# Patient Record
Sex: Female | Born: 1955 | Race: White | Hispanic: No | Marital: Married | Smoking: Never smoker
Health system: Southern US, Community
[De-identification: ages and names within clinical notes are randomized; demographics above are authoritative.]

## PROBLEM LIST (undated history)

## (undated) DIAGNOSIS — K429 Umbilical hernia without obstruction or gangrene: Secondary | ICD-10-CM

## (undated) DIAGNOSIS — K5792 Diverticulitis of intestine, part unspecified, without perforation or abscess without bleeding: Secondary | ICD-10-CM

## (undated) DIAGNOSIS — K56609 Unspecified intestinal obstruction, unspecified as to partial versus complete obstruction: Secondary | ICD-10-CM

## (undated) DIAGNOSIS — K219 Gastro-esophageal reflux disease without esophagitis: Secondary | ICD-10-CM

## (undated) DIAGNOSIS — D259 Leiomyoma of uterus, unspecified: Secondary | ICD-10-CM

## (undated) DIAGNOSIS — E7800 Pure hypercholesterolemia, unspecified: Secondary | ICD-10-CM

## (undated) HISTORY — DX: Leiomyoma of uterus, unspecified: D25.9

## (undated) HISTORY — PX: TONSILLECTOMY: SUR1361

## (undated) HISTORY — DX: Gastro-esophageal reflux disease without esophagitis: K21.9

## (undated) HISTORY — DX: Diverticulitis of intestine, part unspecified, without perforation or abscess without bleeding: K57.92

## (undated) HISTORY — DX: Pure hypercholesterolemia, unspecified: E78.00

## (undated) HISTORY — PX: UMBILICAL HERNIA REPAIR: SHX196

## (undated) HISTORY — DX: Unspecified intestinal obstruction, unspecified as to partial versus complete obstruction: K56.609

## (undated) HISTORY — DX: Umbilical hernia without obstruction or gangrene: K42.9

---

## 1998-02-08 ENCOUNTER — Other Ambulatory Visit: Admission: RE | Admit: 1998-02-08 | Discharge: 1998-02-08 | Payer: Self-pay | Admitting: Family Medicine

## 2001-06-22 ENCOUNTER — Other Ambulatory Visit: Admission: RE | Admit: 2001-06-22 | Discharge: 2001-06-22 | Payer: Self-pay | Admitting: Gynecology

## 2003-03-15 ENCOUNTER — Other Ambulatory Visit: Admission: RE | Admit: 2003-03-15 | Discharge: 2003-03-15 | Payer: Self-pay | Admitting: Gynecology

## 2004-05-23 ENCOUNTER — Other Ambulatory Visit: Admission: RE | Admit: 2004-05-23 | Discharge: 2004-05-23 | Payer: Self-pay | Admitting: Gynecology

## 2005-10-16 ENCOUNTER — Ambulatory Visit: Payer: Self-pay | Admitting: Family Medicine

## 2005-11-04 ENCOUNTER — Other Ambulatory Visit: Admission: RE | Admit: 2005-11-04 | Discharge: 2005-11-04 | Payer: Self-pay | Admitting: Gynecology

## 2011-08-29 LAB — HM COLONOSCOPY

## 2014-02-16 LAB — HM PAP SMEAR: HM Pap smear: NEGATIVE

## 2014-10-05 ENCOUNTER — Other Ambulatory Visit: Payer: Self-pay

## 2016-02-21 ENCOUNTER — Other Ambulatory Visit: Payer: Self-pay

## 2016-03-06 DIAGNOSIS — Z803 Family history of malignant neoplasm of breast: Secondary | ICD-10-CM | POA: Diagnosis not present

## 2016-03-06 DIAGNOSIS — Z1231 Encounter for screening mammogram for malignant neoplasm of breast: Secondary | ICD-10-CM | POA: Diagnosis not present

## 2016-03-06 LAB — HM MAMMOGRAPHY

## 2016-04-09 ENCOUNTER — Encounter: Payer: Self-pay | Admitting: Family Medicine

## 2016-04-15 ENCOUNTER — Ambulatory Visit (INDEPENDENT_AMBULATORY_CARE_PROVIDER_SITE_OTHER): Payer: BLUE CROSS/BLUE SHIELD | Admitting: Family Medicine

## 2016-04-15 ENCOUNTER — Encounter: Payer: Self-pay | Admitting: Family Medicine

## 2016-04-15 VITALS — BP 128/80 | HR 64 | Ht 68.0 in | Wt 201.4 lb

## 2016-04-15 DIAGNOSIS — Z23 Encounter for immunization: Secondary | ICD-10-CM

## 2016-04-15 DIAGNOSIS — Z0000 Encounter for general adult medical examination without abnormal findings: Secondary | ICD-10-CM

## 2016-04-15 DIAGNOSIS — Z1211 Encounter for screening for malignant neoplasm of colon: Secondary | ICD-10-CM | POA: Diagnosis not present

## 2016-04-15 LAB — POCT URINALYSIS DIPSTICK
BILIRUBIN UA: NEGATIVE
Blood, UA: NEGATIVE
GLUCOSE UA: NEGATIVE
Ketones, UA: NEGATIVE
LEUKOCYTES UA: NEGATIVE
NITRITE UA: NEGATIVE
Protein, UA: NEGATIVE
Spec Grav, UA: >=1.03
UROBILINOGEN UA: NEGATIVE
pH, UA: 6

## 2016-04-15 NOTE — Patient Instructions (Signed)
We will call you with lab results.  You received your Tdap injection today. This is good for 10 years.  Return the stool cards at your convenience.   Preventative Care for Adults - Female      MAINTAIN REGULAR HEALTH EXAMS:  A routine yearly physical is a good way to check in with your primary care provider about your health and preventive screening. It is also an opportunity to share updates about your health and any concerns you have, and receive a thorough all-over exam.   Most health insurance companies pay for at least some preventative services.  Check with your health plan for specific coverages.  WHAT PREVENTATIVE SERVICES DO WOMEN NEED?  Adult women should have their weight and blood pressure checked regularly.   Women age 3 and older should have their cholesterol levels checked regularly.  Women should be screened for cervical cancer with a Pap smear and pelvic exam beginning at either age 60, or 3 years after they become sexually activity.    Breast cancer screening generally begins at age 78 with a mammogram and breast exam by your primary care provider.    Beginning at age 4 and continuing to age 16, women should be screened for colorectal cancer.  Certain people may need continued testing until age 65.  Updating vaccinations is part of preventative care.  Vaccinations help protect against diseases such as the flu.  Osteoporosis is a disease in which the bones lose minerals and strength as we age. Women ages 79 and over should discuss this with their caregivers, as should women after menopause who have other risk factors.  Lab tests are generally done as part of preventative care to screen for anemia and blood disorders, to screen for problems with the kidneys and liver, to screen for bladder problems, to check blood sugar, and to check your cholesterol level.  Preventative services generally include counseling about diet, exercise, avoiding tobacco, drugs, excessive  alcohol consumption, and sexually transmitted infections.    GENERAL RECOMMENDATIONS FOR GOOD HEALTH:  Healthy diet:  Eat a variety of foods, including fruit, vegetables, animal or vegetable protein, such as meat, fish, chicken, and eggs, or beans, lentils, tofu, and grains, such as rice.  Drink plenty of water daily.  Decrease saturated fat in the diet, avoid lots of red meat, processed foods, sweets, fast foods, and fried foods.  Exercise:  Aerobic exercise helps maintain good heart health. At least 30-40 minutes of moderate-intensity exercise is recommended. For example, a brisk walk that increases your heart rate and breathing. This should be done on most days of the week.   Find a type of exercise or a variety of exercises that you enjoy so that it becomes a part of your daily life.  Examples are running, walking, swimming, water aerobics, and biking.  For motivation and support, explore group exercise such as aerobic class, spin class, Zumba, Yoga,or  martial arts, etc.    Set exercise goals for yourself, such as a certain weight goal, walk or run in a race such as a 5k walk/run.  Speak to your primary care provider about exercise goals.  Disease prevention:  If you smoke or chew tobacco, find out from your caregiver how to quit. It can literally save your life, no matter how long you have been a tobacco user. If you do not use tobacco, never begin.   Maintain a healthy diet and normal weight. Increased weight leads to problems with blood pressure and diabetes.  The Body Mass Index or BMI is a way of measuring how much of your body is fat. Having a BMI above 27 increases the risk of heart disease, diabetes, hypertension, stroke and other problems related to obesity. Your caregiver can help determine your BMI and based on it develop an exercise and dietary program to help you achieve or maintain this important measurement at a healthful level.  High blood pressure causes heart and  blood vessel problems.  Persistent high blood pressure should be treated with medicine if weight loss and exercise do not work.   Fat and cholesterol leaves deposits in your arteries that can block them. This causes heart disease and vessel disease elsewhere in your body.  If your cholesterol is found to be high, or if you have heart disease or certain other medical conditions, then you may need to have your cholesterol monitored frequently and be treated with medication.   Ask if you should have a cardiac stress test if your history suggests this. A stress test is a test done on a treadmill that looks for heart disease. This test can find disease prior to there being a problem.  Menopause can be associated with physical symptoms and risks. Hormone replacement therapy is available to decrease these. You should talk to your caregiver about whether starting or continuing to take hormones is right for you.   Osteoporosis is a disease in which the bones lose minerals and strength as we age. This can result in serious bone fractures. Risk of osteoporosis can be identified using a bone density scan. Women ages 49 and over should discuss this with their caregivers, as should women after menopause who have other risk factors. Ask your caregiver whether you should be taking a calcium supplement and Vitamin D, to reduce the rate of osteoporosis.   Avoid drinking alcohol in excess (more than two drinks per day).  Avoid use of street drugs. Do not share needles with anyone. Ask for professional help if you need assistance or instructions on stopping the use of alcohol, cigarettes, and/or drugs.  Brush your teeth twice a day with fluoride toothpaste, and floss once a day. Good oral hygiene prevents tooth decay and gum disease. The problems can be painful, unattractive, and can cause other health problems. Visit your dentist for a routine oral and dental check up and preventive care every 6-12 months.   Look at your  skin regularly.  Use a mirror to look at your back. Notify your caregivers of changes in moles, especially if there are changes in shapes, colors, a size larger than a pencil eraser, an irregular border, or development of new moles.  Safety:  Use seatbelts 100% of the time, whether driving or as a passenger.  Use safety devices such as hearing protection if you work in environments with loud noise or significant background noise.  Use safety glasses when doing any work that could send debris in to the eyes.  Use a helmet if you ride a bike or motorcycle.  Use appropriate safety gear for contact sports.  Talk to your caregiver about gun safety.  Use sunscreen with a SPF (or skin protection factor) of 15 or greater.  Lighter skinned people are at a greater risk of skin cancer. Don't forget to also wear sunglasses in order to protect your eyes from too much damaging sunlight. Damaging sunlight can accelerate cataract formation.   Practice safe sex. Use condoms. Condoms are used for birth control and to help reduce the  spread of sexually transmitted infections (or STIs).  Some of the STIs are gonorrhea (the clap), chlamydia, syphilis, trichomonas, herpes, HPV (human papilloma virus) and HIV (human immunodeficiency virus) which causes AIDS. The herpes, HIV and HPV are viral illnesses that have no cure. These can result in disability, cancer and death.   Keep carbon monoxide and smoke detectors in your home functioning at all times. Change the batteries every 6 months or use a model that plugs into the wall.   Vaccinations:  Stay up to date with your tetanus shots and other required immunizations. You should have a booster for tetanus every 10 years. Be sure to get your flu shot every year, since 5%-20% of the [place] population comes down with the flu. The flu vaccine changes each year, so being vaccinated once is not enough. Get your shot in the fall, before the flu season peaks.   Other vaccines to  consider:  Human Papilloma Virus or HPV causes cancer of the cervix, and other infections that can be transmitted from person to person. There is a vaccine for HPV, and females should get immunized between the ages of 58 and 53. It requires a series of 3 shots.   Pneumococcal vaccine to protect against certain types of pneumonia.  This is normally recommended for adults age 32 or older.  However, adults younger than 58 years old with certain underlying conditions such as diabetes, heart or lung disease should also receive the vaccine.  Shingles vaccine to protect against Varicella Zoster if you are older than age 88, or younger than 58 years old with certain underlying illness.  Hepatitis A vaccine to protect against a form of infection of the liver by a virus acquired from food.  Hepatitis B vaccine to protect against a form of infection of the liver by a virus acquired from blood or body fluids, particularly if you work in health care.  If you plan to travel internationally, check with your local health department for specific vaccination recommendations.  Cancer Screening:  Breast cancer screening is essential to preventive care for women. All women age 53 and older should perform a breast self-exam every month. At age 64 and older, women should have their caregiver complete a breast exam each year. Women at ages 14 and older should have a mammogram (x-ray film) of the breasts. Your caregiver can discuss how often you need mammograms.    Cervical cancer screening includes taking a Pap smear (sample of cells examined under a microscope) from the cervix (end of the uterus). It also includes testing for HPV (Human Papilloma Virus, which can cause cervical cancer). Screening and a pelvic exam should begin at age 83, or 3 years after a woman becomes sexually active. Screening should occur every year, with a Pap smear but no HPV testing, up to age 13. After age 20, you should have a Pap smear every 3  years with HPV testing, if no HPV was found previously.   Most routine colon cancer screening begins at the age of 74. On a yearly basis, doctors may provide special easy to use take-home tests to check for hidden blood in the stool. Sigmoidoscopy or colonoscopy can detect the earliest forms of colon cancer and is life saving. These tests use a small camera at the end of a tube to directly examine the colon. Speak to your caregiver about this at age 69, when routine screening begins (and is repeated every 5 years unless early forms of pre-cancerous polyps or  small growths are found).

## 2016-04-15 NOTE — Progress Notes (Signed)
Subjective:    Patient ID: Heather Ochoa, female    DOB: 08/01/1956, 60 y.o.   MRN: [id number]  HPI Chief Complaint  Patient presents with  . new pt    new pt, get established, cpe   She is new to the practice and here for a complete physical exam. Previous medical care: Kendall Flack MD OB/GYN  Last CPE: summer 2016  Other providers: OB/GYN- [facility]. Dr. Clydene Laming- [facility]. Dr. Carlye Grippe- Dentist. [place] [facility]. Chiropractor.   Past medical history: none Surgeries: hernia repair age 48. Tonsillectomy.   Family history: mother 71- CHF, father died of pneumonia.  Mental Health History: none  Social history: Lives with husband, no kids works in [employer] for [employer].  Denies smoking, 3-4 ddrinks per week. drinking alcohol, no drug use  Diet: nothing particular  Excerise: 5 times per week, hot yoga and 2 miles on treadmill.   Immunizations: Tdap - unknown.   Health maintenance:  Mammogram: 3 months ago- normal Colonoscopy: 7 years ago and normal Last Gynecological Exam: within past 2 years Last Menstrual cycle: may 15th  Pregnancies: 1- adoption Last Dental Exam: in past 6 months Last Eye Exam: October 2016- contact lenses  States she has tested negative for Hep C at OB/GYN.  Declines STD testing.   Wears seatbelt always, uses sunscreen, smoke detectors in home and functioning, does not text while driving and feels safe in home environment.   Reviewed allergies, medications, past medical, surgical, family, and social history.     Review of Systems Review of Systems Constitutional: -fever, -chills, -sweats, -unexpected weight change,-fatigue ENT: -runny nose, -ear pain, -sore throat Cardiology:  -chest pain, -palpitations, -edema Respiratory: -cough, -shortness of breath, -wheezing Gastroenterology: -abdominal pain, -nausea, -vomiting, -diarrhea, -constipation  Hematology: -bleeding or bruising problems Musculoskeletal: -arthralgias, -myalgias,  -joint swelling, -back pain Ophthalmology: -vision changes Urology: -dysuria, -difficulty urinating, -hematuria, -urinary frequency, -urgency Neurology: -headache, -weakness, -tingling, -numbness       Objective:   Physical Exam BP 128/80 mmHg  Pulse 64  Ht 5\' 8"  (1.727 m)  Wt 201 lb 6.4 oz (91.354 kg)  BMI 30.63 kg/m2  LMP 04/02/2016  General Appearance:    Alert, cooperative, no distress, appears stated age  Head:    Normocephalic, without obvious abnormality, atraumatic  Eyes:    PERRL, conjunctiva/corneas clear, EOM's intact, fundi    benign  Ears:    Normal TM's and external ear canals  Nose:   Nares normal, mucosa normal, no drainage or sinus   tenderness  Throat:   Lips, mucosa, and tongue normal; teeth and gums normal  Neck:   Supple, no lymphadenopathy;  thyroid:  no   enlargement/tenderness/nodules; no carotid   bruit or JVD  Back:    Spine nontender, no curvature, ROM normal, no CVA     tenderness  Lungs:     Clear to auscultation bilaterally without wheezes, rales or     ronchi; respirations unlabored  Chest Wall:    No tenderness or deformity   Heart:    Regular rate and rhythm, S1 and S2 normal, no murmur, rub   or gallop  Breast Exam:    Done at Gynecologist.       No axillary lymphadenopathy  Abdomen:     Soft, non-tender, nondistended, normoactive bowel sounds,    no masses, no hepatosplenomegaly  Genitalia:    Done at gynecologist   Rectal:    Stool cards sent.   Extremities:   No clubbing, cyanosis or  edema  Pulses:   2+ and symmetric all extremities  Skin:   Skin color, texture, turgor normal, no rashes or lesions  Lymph nodes:   Cervical, supraclavicular, and axillary nodes normal  Neurologic:   CNII-XII intact, normal strength, sensation and gait; reflexes 2+ and symmetric throughout          Psych:   Normal mood, affect, hygiene and grooming.      Urinalysis dipstick: negative    Assessment & Plan:  Routine general medical examination at a health  care facility - Plan: POCT urinalysis dipstick, CBC with Differential/Platelet, Comprehensive metabolic panel, TSH  Need for Tdap vaccination - Plan: Tdap vaccine greater than or equal to 7yo IM  Special screening for malignant neoplasms, colon - Plan: POCT occult blood stool  She recently had labs done at her job and brought these in to be scanned in our system. Her HDL was 60, total cholesterol 232, LDL 137, triglycerides 175. She states her cholesterol has been stable for "years".  2.7% ASCVD risk.  Tdap given today. Discussed possible side effects.  Stool cards given with instructions to return at her convenience. Last colonoscopy 7 years ago.  Discussed eating a healthy diet and getting at least 150 minutes of physical activity per week. Discussed safety and health prevention. Follow-up pending labs or in 1 year.

## 2016-04-16 LAB — COMPREHENSIVE METABOLIC PANEL
ALT: 21 U/L (ref 6–29)
AST: 15 U/L (ref 10–35)
Albumin: 4.2 g/dL (ref 3.6–5.1)
Alkaline Phosphatase: 66 U/L (ref 33–130)
BILIRUBIN TOTAL: 0.4 mg/dL (ref 0.2–1.2)
BUN: 15 mg/dL (ref 7–25)
CHLORIDE: 104 mmol/L (ref 98–110)
CO2: 21 mmol/L (ref 20–31)
CREATININE: 0.76 mg/dL (ref 0.50–1.05)
Calcium: 9.3 mg/dL (ref 8.6–10.4)
Glucose, Bld: 84 mg/dL (ref 65–99)
Potassium: 4 mmol/L (ref 3.5–5.3)
SODIUM: 138 mmol/L (ref 135–146)
Total Protein: 6.9 g/dL (ref 6.1–8.1)

## 2016-04-16 LAB — CBC WITH DIFFERENTIAL/PLATELET
BASOS ABS: 0 {cells}/uL (ref 0–200)
Basophils Relative: 0 %
EOS PCT: 2 %
Eosinophils Absolute: 150 cells/uL (ref 15–500)
HCT: 44.4 % (ref 35.0–45.0)
Hemoglobin: 14.9 g/dL (ref 11.7–15.5)
LYMPHS ABS: 3600 {cells}/uL (ref 850–3900)
Lymphocytes Relative: 48 %
MCH: 29.6 pg (ref 27.0–33.0)
MCHC: 33.6 g/dL (ref 32.0–36.0)
MCV: 88.1 fL (ref 80.0–100.0)
MONOS PCT: 6 %
MPV: 9.1 fL (ref 7.5–12.5)
Monocytes Absolute: 450 cells/uL (ref 200–950)
NEUTROS ABS: 3300 {cells}/uL (ref 1500–7800)
Neutrophils Relative %: 44 %
PLATELETS: 287 10*3/uL (ref 140–400)
RBC: 5.04 MIL/uL (ref 3.80–5.10)
RDW: 13.5 % (ref 11.0–15.0)
WBC: 7.5 10*3/uL (ref 4.0–10.5)

## 2016-04-16 LAB — TSH: TSH: 2.15 mIU/L

## 2016-05-01 ENCOUNTER — Telehealth: Payer: Self-pay

## 2016-05-01 NOTE — Telephone Encounter (Signed)
Records rcvd from [facility].

## 2016-05-02 ENCOUNTER — Encounter: Payer: Self-pay | Admitting: Family Medicine

## 2016-05-06 ENCOUNTER — Encounter: Payer: Self-pay | Admitting: Family Medicine

## 2017-01-29 ENCOUNTER — Other Ambulatory Visit (INDEPENDENT_AMBULATORY_CARE_PROVIDER_SITE_OTHER): Payer: BLUE CROSS/BLUE SHIELD

## 2017-01-29 DIAGNOSIS — Z0000 Encounter for general adult medical examination without abnormal findings: Secondary | ICD-10-CM | POA: Diagnosis not present

## 2017-01-29 LAB — POC HEMOCCULT BLD/STL (HOME/3-CARD/SCREEN)
Card #3 Fecal Occult Blood, POC: NEGATIVE
FECAL OCCULT BLD: NEGATIVE
Fecal Occult Blood, POC: NEGATIVE

## 2017-05-14 ENCOUNTER — Other Ambulatory Visit (HOSPITAL_COMMUNITY)
Admission: RE | Admit: 2017-05-14 | Discharge: 2017-05-14 | Disposition: A | Discharge status: Home / Self Care | Payer: BLUE CROSS/BLUE SHIELD | Source: Ambulatory Visit | Attending: Family Medicine | Admitting: Family Medicine

## 2017-05-14 ENCOUNTER — Encounter: Payer: Self-pay | Admitting: Family Medicine

## 2017-05-14 ENCOUNTER — Ambulatory Visit (INDEPENDENT_AMBULATORY_CARE_PROVIDER_SITE_OTHER): Payer: BLUE CROSS/BLUE SHIELD | Admitting: Family Medicine

## 2017-05-14 VITALS — BP 120/80 | HR 65 | Ht 67.25 in | Wt 204.8 lb

## 2017-05-14 DIAGNOSIS — Z124 Encounter for screening for malignant neoplasm of cervix: Secondary | ICD-10-CM | POA: Diagnosis not present

## 2017-05-14 DIAGNOSIS — Z1231 Encounter for screening mammogram for malignant neoplasm of breast: Secondary | ICD-10-CM | POA: Diagnosis not present

## 2017-05-14 DIAGNOSIS — E669 Obesity, unspecified: Secondary | ICD-10-CM | POA: Insufficient documentation

## 2017-05-14 DIAGNOSIS — Z1159 Encounter for screening for other viral diseases: Secondary | ICD-10-CM

## 2017-05-14 DIAGNOSIS — Z114 Encounter for screening for human immunodeficiency virus [HIV]: Secondary | ICD-10-CM | POA: Diagnosis not present

## 2017-05-14 DIAGNOSIS — Z0000 Encounter for general adult medical examination without abnormal findings: Secondary | ICD-10-CM

## 2017-05-14 DIAGNOSIS — E7800 Pure hypercholesterolemia, unspecified: Secondary | ICD-10-CM

## 2017-05-14 DIAGNOSIS — R29898 Other symptoms and signs involving the musculoskeletal system: Secondary | ICD-10-CM

## 2017-05-14 DIAGNOSIS — Z1239 Encounter for other screening for malignant neoplasm of breast: Secondary | ICD-10-CM

## 2017-05-14 DIAGNOSIS — H6122 Impacted cerumen, left ear: Secondary | ICD-10-CM

## 2017-05-14 DIAGNOSIS — E66811 Obesity, class 1: Secondary | ICD-10-CM

## 2017-05-14 HISTORY — DX: Other symptoms and signs involving the musculoskeletal system: R29.898

## 2017-05-14 LAB — POCT URINALYSIS DIP (PROADVANTAGE DEVICE)
Bilirubin, UA: NEGATIVE
Blood, UA: NEGATIVE
Glucose, UA: NEGATIVE mg/dL
Ketones, POC UA: NEGATIVE mg/dL
Leukocytes, UA: NEGATIVE
Nitrite, UA: NEGATIVE
Protein Ur, POC: NEGATIVE mg/dL
Specific Gravity, Urine: 1.025
Urobilinogen, Ur: NEGATIVE
pH, UA: 6

## 2017-05-14 LAB — CBC WITH DIFFERENTIAL/PLATELET
Basophils Absolute: 57 {cells}/uL (ref 0–200)
Basophils Relative: 1 %
Eosinophils Absolute: 114 {cells}/uL (ref 15–500)
Eosinophils Relative: 2 %
HCT: 43.8 % (ref 35.0–45.0)
Hemoglobin: 14.9 g/dL (ref 11.7–15.5)
Lymphocytes Relative: 52 %
Lymphs Abs: 2964 {cells}/uL (ref 850–3900)
MCH: 29.9 pg (ref 27.0–33.0)
MCHC: 34 g/dL (ref 32.0–36.0)
MCV: 87.8 fL (ref 80.0–100.0)
MPV: 8.9 fL (ref 7.5–12.5)
Monocytes Absolute: 285 {cells}/uL (ref 200–950)
Monocytes Relative: 5 %
Neutro Abs: 2280 {cells}/uL (ref 1500–7800)
Neutrophils Relative %: 40 %
Platelets: 267 K/uL (ref 140–400)
RBC: 4.99 MIL/uL (ref 3.80–5.10)
RDW: 13.1 % (ref 11.0–15.0)
WBC: 5.7 K/uL (ref 4.0–10.5)

## 2017-05-14 LAB — TSH: TSH: 2.29 m[IU]/L

## 2017-05-14 NOTE — Patient Instructions (Addendum)
Call your insurance company and let me know if you decide to get the shingles vaccine (Shingrix).  Call and schedule your mammogram.   Make sure you are eating a healthy diet. Take a multivitamin with vitamin D.  Continue exercising at least 150 minutes per week.   We will call you with lab results.   If your ankle get worse or you have any new symptoms let me know.    Preventative Care for Adults - Female      MAINTAIN REGULAR HEALTH EXAMS:  A routine yearly physical is a good way to check in with your primary care provider about your health and preventive screening. It is also an opportunity to share updates about your health and any concerns you have, and receive a thorough all-over exam.   Most health insurance companies pay for at least some preventative services.  Check with your health plan for specific coverages.  WHAT PREVENTATIVE SERVICES DO WOMEN NEED?  Adult women should have their weight and blood pressure checked regularly.   Women age 57 and older should have their cholesterol levels checked regularly.  Women should be screened for cervical cancer with a Pap smear and pelvic exam beginning at either age 40, or 3 years after they become sexually activity.    Breast cancer screening generally begins at age 26 with a mammogram and breast exam by your primary care provider.    Beginning at age 17 and continuing to age 68, women should be screened for colorectal cancer.  Certain people may need continued testing until age 2.  Updating vaccinations is part of preventative care.  Vaccinations help protect against diseases such as the flu.  Osteoporosis is a disease in which the bones lose minerals and strength as we age. Women ages 13 and over should discuss this with their caregivers, as should women after menopause who have other risk factors.  Lab tests are generally done as part of preventative care to screen for anemia and blood disorders, to screen for problems with  the kidneys and liver, to screen for bladder problems, to check blood sugar, and to check your cholesterol level.  Preventative services generally include counseling about diet, exercise, avoiding tobacco, drugs, excessive alcohol consumption, and sexually transmitted infections.    GENERAL RECOMMENDATIONS FOR GOOD HEALTH:  Healthy diet:  Eat a variety of foods, including fruit, vegetables, animal or vegetable protein, such as meat, fish, chicken, and eggs, or beans, lentils, tofu, and grains, such as rice.  Drink plenty of water daily.  Decrease saturated fat in the diet, avoid lots of red meat, processed foods, sweets, fast foods, and fried foods.  Exercise:  Aerobic exercise helps maintain good heart health. At least 30-40 minutes of moderate-intensity exercise is recommended. For example, a brisk walk that increases your heart rate and breathing. This should be done on most days of the week.   Find a type of exercise or a variety of exercises that you enjoy so that it becomes a part of your daily life.  Examples are running, walking, swimming, water aerobics, and biking.  For motivation and support, explore group exercise such as aerobic class, spin class, Zumba, Yoga,or  martial arts, etc.    Set exercise goals for yourself, such as a certain weight goal, walk or run in a race such as a 5k walk/run.  Speak to your primary care provider about exercise goals.  Disease prevention:  If you smoke or chew tobacco, find out from your caregiver how  to quit. It can literally save your life, no matter how long you have been a tobacco user. If you do not use tobacco, never begin.   Maintain a healthy diet and normal weight. Increased weight leads to problems with blood pressure and diabetes.   The Body Mass Index or BMI is a way of measuring how much of your body is fat. Having a BMI above 27 increases the risk of heart disease, diabetes, hypertension, stroke and other problems related to  obesity. Your caregiver can help determine your BMI and based on it develop an exercise and dietary program to help you achieve or maintain this important measurement at a healthful level.  High blood pressure causes heart and blood vessel problems.  Persistent high blood pressure should be treated with medicine if weight loss and exercise do not work.   Fat and cholesterol leaves deposits in your arteries that can block them. This causes heart disease and vessel disease elsewhere in your body.  If your cholesterol is found to be high, or if you have heart disease or certain other medical conditions, then you may need to have your cholesterol monitored frequently and be treated with medication.   Ask if you should have a cardiac stress test if your history suggests this. A stress test is a test done on a treadmill that looks for heart disease. This test can find disease prior to there being a problem.  Menopause can be associated with physical symptoms and risks. Hormone replacement therapy is available to decrease these. You should talk to your caregiver about whether starting or continuing to take hormones is right for you.   Osteoporosis is a disease in which the bones lose minerals and strength as we age. This can result in serious bone fractures. Risk of osteoporosis can be identified using a bone density scan. Women ages 31 and over should discuss this with their caregivers, as should women after menopause who have other risk factors. Ask your caregiver whether you should be taking a calcium supplement and Vitamin D, to reduce the rate of osteoporosis.   Avoid drinking alcohol in excess (more than two drinks per day).  Avoid use of street drugs. Do not share needles with anyone. Ask for professional help if you need assistance or instructions on stopping the use of alcohol, cigarettes, and/or drugs.  Brush your teeth twice a day with fluoride toothpaste, and floss once a day. Good oral hygiene  prevents tooth decay and gum disease. The problems can be painful, unattractive, and can cause other health problems. Visit your dentist for a routine oral and dental check up and preventive care every 6-12 months.   Look at your skin regularly.  Use a mirror to look at your back. Notify your caregivers of changes in moles, especially if there are changes in shapes, colors, a size larger than a pencil eraser, an irregular border, or development of new moles.  Safety:  Use seatbelts 100% of the time, whether driving or as a passenger.  Use safety devices such as hearing protection if you work in environments with loud noise or significant background noise.  Use safety glasses when doing any work that could send debris in to the eyes.  Use a helmet if you ride a bike or motorcycle.  Use appropriate safety gear for contact sports.  Talk to your caregiver about gun safety.  Use sunscreen with a SPF (or skin protection factor) of 15 or greater.  Lighter skinned people are at  a greater risk of skin cancer. Don't forget to also wear sunglasses in order to protect your eyes from too much damaging sunlight. Damaging sunlight can accelerate cataract formation.   Practice safe sex. Use condoms. Condoms are used for birth control and to help reduce the spread of sexually transmitted infections (or STIs).  Some of the STIs are gonorrhea (the clap), chlamydia, syphilis, trichomonas, herpes, HPV (human papilloma virus) and HIV (human immunodeficiency virus) which causes AIDS. The herpes, HIV and HPV are viral illnesses that have no cure. These can result in disability, cancer and death.   Keep carbon monoxide and smoke detectors in your home functioning at all times. Change the batteries every 6 months or use a model that plugs into the wall.   Vaccinations:  Stay up to date with your tetanus shots and other required immunizations. You should have a booster for tetanus every 10 years. Be sure to get your flu shot  every year, since 5%-20% of the [place] population comes down with the flu. The flu vaccine changes each year, so being vaccinated once is not enough. Get your shot in the fall, before the flu season peaks.   Other vaccines to consider:  Human Papilloma Virus or HPV causes cancer of the cervix, and other infections that can be transmitted from person to person. There is a vaccine for HPV, and females should get immunized between the ages of 72 and 5. It requires a series of 3 shots.   Pneumococcal vaccine to protect against certain types of pneumonia.  This is normally recommended for adults age 5 or older.  However, adults younger than 59 years old with certain underlying conditions such as diabetes, heart or lung disease should also receive the vaccine.  Shingles vaccine to protect against Varicella Zoster if you are older than age 35, or younger than 59 years old with certain underlying illness.  Hepatitis A vaccine to protect against a form of infection of the liver by a virus acquired from food.  Hepatitis B vaccine to protect against a form of infection of the liver by a virus acquired from blood or body fluids, particularly if you work in health care.  If you plan to travel internationally, check with your local health department for specific vaccination recommendations.  Cancer Screening:  Breast cancer screening is essential to preventive care for women. All women age 110 and older should perform a breast self-exam every month. At age 64 and older, women should have their caregiver complete a breast exam each year. Women at ages 59 and older should have a mammogram (x-ray film) of the breasts. Your caregiver can discuss how often you need mammograms.    Cervical cancer screening includes taking a Pap smear (sample of cells examined under a microscope) from the cervix (end of the uterus). It also includes testing for HPV (Human Papilloma Virus, which can cause cervical cancer). Screening and  a pelvic exam should begin at age 22, or 3 years after a woman becomes sexually active. Screening should occur every year, with a Pap smear but no HPV testing, up to age 84. After age 38, you should have a Pap smear every 3 years with HPV testing, if no HPV was found previously.   Most routine colon cancer screening begins at the age of 34. On a yearly basis, doctors may provide special easy to use take-home tests to check for hidden blood in the stool. Sigmoidoscopy or colonoscopy can detect the earliest forms of colon cancer  and is life saving. These tests use a small camera at the end of a tube to directly examine the colon. Speak to your caregiver about this at age 69, when routine screening begins (and is repeated every 5 years unless early forms of pre-cancerous polyps or small growths are found).

## 2017-05-14 NOTE — Progress Notes (Signed)
Subjective:    Patient ID: Heather Ochoa, female    DOB: 1955/11/22, 61 y.o.   MRN: [id number]  HPI Chief Complaint  Patient presents with  . fasting cpe    fasting cpe, ankle popping out of joint. pap smear- 2 years ago, eye exam done in december 2017   She is here for a complete physical exam.  Last CPE: 03/2016  Complains of a 2 month history of right ankle "popping". States symptoms are actually improving and bothering her very little lately. Denies injury. No redness, warmth, pain or tenderness. No numbness, tingling or weakness.   Other providers: OB/GYN- [facility]. Dr. Clydene Laming- [facility]. Dr. Carlye Grippe- Dentist. [place] [facility]. Chiropractor-Dr. Carma Leaven every 3 weeks. GI- Dr. Collene Mares    Social history: Lives with husband, no kids, works in [employer] for [employer].  Diet: fairly unhealthy  Excerise: yoga, treadmill   Immunizations: Tdap up to date. Shingles - educated her   Health maintenance:  Mammogram: last year.  Colonoscopy: 8 years ago. Due in 2 years.  Last Pap Smear: 3 years ago at her OB/GYN office. No records.  Last Menstrual cycle: 03/2016  Last Dental Exam: twice annually  Last Eye Exam: December 2017   Wears seatbelt always, uses sunscreen, smoke detectors in home and functioning, does not text while driving and feels safe in home environment.   Reviewed allergies, medications, past medical, surgical, family, and social history.   Review of Systems Review of Systems Constitutional: -fever, -chills, -sweats, -unexpected weight change,-fatigue ENT: -runny nose, -ear pain, -sore throat Cardiology:  -chest pain, -palpitations, -edema Respiratory: -cough, -shortness of breath, -wheezing Gastroenterology: -abdominal pain, -nausea, -vomiting, -diarrhea, -constipation  Hematology: -bleeding or bruising problems Musculoskeletal: -arthralgias, -myalgias, -joint swelling, -back pain Ophthalmology: -vision changes Urology: -dysuria, -difficulty urinating,  -hematuria, -urinary frequency, -urgency Neurology: -headache, -weakness, -tingling, -numbness       Objective:   Physical Exam BP 120/80   Pulse 65   Ht 5' 7.25" (1.708 m)   Wt 204 lb 12.8 oz (92.9 kg)   LMP 04/02/2016   BMI 31.84 kg/m   General Appearance:    Alert, cooperative, no distress, appears stated age  Head:    Normocephalic, without obvious abnormality, atraumatic  Eyes:    PERRL, conjunctiva/corneas clear, EOM's intact, fundi    benign  Ears:    Normal left TM and canal. Cannot visualize right TM, canal with cerumen impaction, non tender, no drainage  Nose:   Nares normal, mucosa normal, no drainage or sinus   tenderness  Throat:   Lips, mucosa, and tongue normal; teeth and gums normal  Neck:   Supple, no lymphadenopathy;  thyroid:  no   enlargement/tenderness/nodules; no carotid   bruit or JVD  Back:    Spine nontender, no curvature, ROM normal, no CVA     tenderness  Lungs:     Clear to auscultation bilaterally without wheezes, rales or     ronchi; respirations unlabored  Chest Wall:    No tenderness or deformity   Heart:    Regular rate and rhythm, S1 and S2 normal, no murmur, rub   or gallop  Breast Exam:    No tenderness, masses, or nipple discharge or inversion.      No axillary lymphadenopathy  Abdomen:     Soft, non-tender, nondistended, normoactive bowel sounds,    no masses, no hepatosplenomegaly  Genitalia:    Normal external genitalia without lesions.  BUS and vagina normal; cervix without lesions, or cervical motion  tenderness. No abnormal vaginal discharge.  Uterus and adnexa not enlarged, nontender, no masses.  Pap performed     Extremities:   No clubbing, cyanosis or edema  Pulses:   2+ and symmetric all extremities  Skin:   Skin color, texture, turgor normal, no rashes or lesions  Lymph nodes:   Cervical, supraclavicular, and axillary nodes normal  Neurologic:   CNII-XII intact, normal strength, sensation and gait; reflexes 2+ and symmetric  throughout          Psych:   Normal mood, affect, hygiene and grooming.    Urinalysis dipstick: neg      Assessment & Plan:  Routine general medical examination at a health care facility - Plan: POCT Urinalysis DIP (Proadvantage Device), CBC with Differential/Platelet, Comprehensive metabolic panel, TSH, Lipid panel  Need for hepatitis C screening test - Plan: Hepatitis C antibody  Screening for HIV (human immunodeficiency virus) - Plan: HIV antibody  Screening for breast cancer - Plan: MM DIGITAL SCREENING BILATERAL  Screening for cervical cancer - Plan: Cytology - PAP  Hypercholesteremia - Plan: Lipid panel  Obesity (BMI 30.0-34.9) - Plan: TSH, Lipid panel  Ankle clicking  Impacted cerumen of left ear  Overall she appears to be doing well.  Ear lavage done bilaterally by Sabrina, CMA, she tolerated this well. Right ear canal clear and TM normal post lavage. Left ear is still impacted. She will try Debrox for the next 2-3 days and follow up if needed.    Will get records from OB/GYN and GI. She reports being up to date on her colonoscopy.  Pap smear done and she tolerated this well. Chaperone present.  She will let me know if her ankle worsens but will do watchful waiting for now.  Counseled on healthy diet and exercise for weight loss.  Will check fasting lipids.   She will call her insurance company and let me know if she decides to get Shingrix. Counseling done.  Hep C test done.  HIV testing done.  Mammogram ordered.  Follow up pending labs.

## 2017-05-15 LAB — COMPREHENSIVE METABOLIC PANEL
ALBUMIN: 4.3 g/dL (ref 3.6–5.1)
ALT: 20 U/L (ref 6–29)
AST: 18 U/L (ref 10–35)
Alkaline Phosphatase: 73 U/L (ref 33–130)
BILIRUBIN TOTAL: 0.5 mg/dL (ref 0.2–1.2)
BUN: 13 mg/dL (ref 7–25)
CALCIUM: 9.3 mg/dL (ref 8.6–10.4)
CHLORIDE: 105 mmol/L (ref 98–110)
CO2: 20 mmol/L (ref 20–32)
Creat: 0.78 mg/dL (ref 0.50–1.05)
GLUCOSE: 89 mg/dL (ref 65–99)
POTASSIUM: 4.3 mmol/L (ref 3.5–5.3)
Sodium: 139 mmol/L (ref 135–146)
Total Protein: 6.8 g/dL (ref 6.1–8.1)

## 2017-05-15 LAB — LIPID PANEL
CHOL/HDL RATIO: 4.1 ratio (ref <5.0)
CHOLESTEROL: 248 mg/dL — AB (ref <200)
HDL: 61 mg/dL (ref >50)
LDL Cholesterol: 156 mg/dL — ABNORMAL HIGH (ref <100)
Triglycerides: 154 mg/dL — ABNORMAL HIGH (ref <150)
VLDL: 31 mg/dL — ABNORMAL HIGH (ref <30)

## 2017-05-15 LAB — HEPATITIS C ANTIBODY: HCV AB: NONREACTIVE

## 2017-05-15 LAB — CYTOLOGY - PAP
DIAGNOSIS: NEGATIVE
HPV: NOT DETECTED

## 2017-05-15 LAB — HIV ANTIBODY (ROUTINE TESTING W REFLEX): HIV 1&2 Ab, 4th Generation: NONREACTIVE

## 2017-05-18 ENCOUNTER — Encounter: Payer: Self-pay | Admitting: Family Medicine

## 2017-05-19 ENCOUNTER — Encounter: Payer: Self-pay | Admitting: Family Medicine

## 2017-05-20 ENCOUNTER — Telehealth: Payer: Self-pay | Admitting: Family Medicine

## 2017-05-20 NOTE — Telephone Encounter (Signed)
Received requested pap report from [facility] ob/gyn. Sending back for review.

## 2017-05-25 ENCOUNTER — Telehealth: Payer: Self-pay | Admitting: Family Medicine

## 2017-05-25 NOTE — Telephone Encounter (Signed)
Ok to give her this when we get them in. She can get this at a pharmacy also if she would like.

## 2017-05-25 NOTE — Telephone Encounter (Signed)
Pt called & states she wanted to be put on the list for Shingrix, she checked with insurance and is covered.

## 2017-05-25 NOTE — Telephone Encounter (Signed)
We do not have any more shingrix and this would be a new start. Is it ok?

## 2017-05-25 NOTE — Telephone Encounter (Signed)
Left a detailed message that we have put her on the list but not sure when we will get them in. But she can check with pharmacy

## 2017-05-28 ENCOUNTER — Telehealth: Payer: Self-pay | Admitting: Internal Medicine

## 2017-05-28 NOTE — Telephone Encounter (Signed)
We now have shingrix and have 2 saved for pt in refrig. Pt will call back after she looks at her schedule and call back to schedule

## 2017-05-29 ENCOUNTER — Other Ambulatory Visit (INDEPENDENT_AMBULATORY_CARE_PROVIDER_SITE_OTHER): Payer: BLUE CROSS/BLUE SHIELD

## 2017-05-29 DIAGNOSIS — Z23 Encounter for immunization: Secondary | ICD-10-CM | POA: Diagnosis not present

## 2017-07-14 DIAGNOSIS — Z1231 Encounter for screening mammogram for malignant neoplasm of breast: Secondary | ICD-10-CM | POA: Diagnosis not present

## 2017-07-14 LAB — HM MAMMOGRAPHY

## 2017-07-16 ENCOUNTER — Encounter: Payer: Self-pay | Admitting: Family Medicine

## 2017-07-17 DIAGNOSIS — R922 Inconclusive mammogram: Secondary | ICD-10-CM | POA: Diagnosis not present

## 2017-07-17 DIAGNOSIS — N6321 Unspecified lump in the left breast, upper outer quadrant: Secondary | ICD-10-CM | POA: Diagnosis not present

## 2017-07-17 LAB — HM MAMMOGRAPHY

## 2017-07-21 ENCOUNTER — Encounter: Payer: Self-pay | Admitting: Internal Medicine

## 2017-09-02 ENCOUNTER — Other Ambulatory Visit: Payer: BLUE CROSS/BLUE SHIELD

## 2017-09-15 ENCOUNTER — Other Ambulatory Visit (INDEPENDENT_AMBULATORY_CARE_PROVIDER_SITE_OTHER): Payer: BLUE CROSS/BLUE SHIELD

## 2017-09-15 DIAGNOSIS — Z23 Encounter for immunization: Secondary | ICD-10-CM

## 2017-10-06 ENCOUNTER — Other Ambulatory Visit: Payer: BLUE CROSS/BLUE SHIELD

## 2017-12-27 DIAGNOSIS — J019 Acute sinusitis, unspecified: Secondary | ICD-10-CM | POA: Diagnosis not present

## 2017-12-27 DIAGNOSIS — R05 Cough: Secondary | ICD-10-CM | POA: Diagnosis not present

## 2018-01-27 DIAGNOSIS — N6312 Unspecified lump in the right breast, upper inner quadrant: Secondary | ICD-10-CM | POA: Diagnosis not present

## 2018-01-27 LAB — HM MAMMOGRAPHY

## 2018-01-28 ENCOUNTER — Encounter: Payer: Self-pay | Admitting: Family Medicine

## 2018-01-30 ENCOUNTER — Other Ambulatory Visit: Payer: Self-pay

## 2018-01-30 ENCOUNTER — Emergency Department (HOSPITAL_COMMUNITY): Payer: BLUE CROSS/BLUE SHIELD

## 2018-01-30 ENCOUNTER — Encounter (HOSPITAL_COMMUNITY): Payer: Self-pay | Admitting: Emergency Medicine

## 2018-01-30 ENCOUNTER — Inpatient Hospital Stay (HOSPITAL_COMMUNITY)
Admission: EM | Admit: 2018-01-30 | Discharge: 2018-02-03 | DRG: 392 | Disposition: A | Discharge status: Home / Self Care | Payer: BLUE CROSS/BLUE SHIELD | Attending: General Surgery | Admitting: General Surgery

## 2018-01-30 DIAGNOSIS — K5792 Diverticulitis of intestine, part unspecified, without perforation or abscess without bleeding: Secondary | ICD-10-CM | POA: Diagnosis not present

## 2018-01-30 DIAGNOSIS — K219 Gastro-esophageal reflux disease without esophagitis: Secondary | ICD-10-CM | POA: Diagnosis present

## 2018-01-30 DIAGNOSIS — K5720 Diverticulitis of large intestine with perforation and abscess without bleeding: Principal | ICD-10-CM | POA: Diagnosis present

## 2018-01-30 DIAGNOSIS — Z6830 Body mass index (BMI) 30.0-30.9, adult: Secondary | ICD-10-CM | POA: Diagnosis not present

## 2018-01-30 DIAGNOSIS — R1110 Vomiting, unspecified: Secondary | ICD-10-CM | POA: Diagnosis not present

## 2018-01-30 DIAGNOSIS — R1084 Generalized abdominal pain: Secondary | ICD-10-CM | POA: Diagnosis not present

## 2018-01-30 DIAGNOSIS — Z880 Allergy status to penicillin: Secondary | ICD-10-CM | POA: Diagnosis not present

## 2018-01-30 DIAGNOSIS — R109 Unspecified abdominal pain: Secondary | ICD-10-CM | POA: Diagnosis not present

## 2018-01-30 DIAGNOSIS — R197 Diarrhea, unspecified: Secondary | ICD-10-CM | POA: Diagnosis not present

## 2018-01-30 DIAGNOSIS — E7800 Pure hypercholesterolemia, unspecified: Secondary | ICD-10-CM | POA: Diagnosis not present

## 2018-01-30 DIAGNOSIS — R1033 Periumbilical pain: Secondary | ICD-10-CM | POA: Diagnosis not present

## 2018-01-30 DIAGNOSIS — E669 Obesity, unspecified: Secondary | ICD-10-CM | POA: Diagnosis present

## 2018-01-30 DIAGNOSIS — Z8249 Family history of ischemic heart disease and other diseases of the circulatory system: Secondary | ICD-10-CM

## 2018-01-30 DIAGNOSIS — K5780 Diverticulitis of intestine, part unspecified, with perforation and abscess without bleeding: Secondary | ICD-10-CM | POA: Diagnosis present

## 2018-01-30 LAB — COMPREHENSIVE METABOLIC PANEL
ALT: 25 U/L (ref 14–54)
AST: 20 U/L (ref 15–41)
Albumin: 4.1 g/dL (ref 3.5–5.0)
Alkaline Phosphatase: 76 U/L (ref 38–126)
Anion gap: 14 (ref 5–15)
BUN: 9 mg/dL (ref 6–20)
CHLORIDE: 96 mmol/L — AB (ref 101–111)
CO2: 22 mmol/L (ref 22–32)
Calcium: 9.6 mg/dL (ref 8.9–10.3)
Creatinine, Ser: 0.97 mg/dL (ref 0.44–1.00)
GFR calc non Af Amer: >60 mL/min (ref >60)
Glucose, Bld: 129 mg/dL — ABNORMAL HIGH (ref 65–99)
Potassium: 3.8 mmol/L (ref 3.5–5.1)
SODIUM: 132 mmol/L — AB (ref 135–145)
Total Bilirubin: 1.2 mg/dL (ref 0.3–1.2)
Total Protein: 7.3 g/dL (ref 6.5–8.1)

## 2018-01-30 LAB — CBC
HCT: 46.3 % — ABNORMAL HIGH (ref 36.0–46.0)
HEMOGLOBIN: 15.7 g/dL — AB (ref 12.0–15.0)
MCH: 30.1 pg (ref 26.0–34.0)
MCHC: 33.9 g/dL (ref 30.0–36.0)
MCV: 88.7 fL (ref 78.0–100.0)
Platelets: 278 10*3/uL (ref 150–400)
RBC: 5.22 MIL/uL — AB (ref 3.87–5.11)
RDW: 14 % (ref 11.5–15.5)
WBC: 14.6 10*3/uL — AB (ref 4.0–10.5)

## 2018-01-30 LAB — URINALYSIS, ROUTINE W REFLEX MICROSCOPIC
Bilirubin Urine: NEGATIVE
GLUCOSE, UA: NEGATIVE mg/dL
HGB URINE DIPSTICK: NEGATIVE
Ketones, ur: NEGATIVE mg/dL
LEUKOCYTES UA: NEGATIVE
Nitrite: NEGATIVE
PROTEIN: NEGATIVE mg/dL
Specific Gravity, Urine: 1.016 (ref 1.005–1.030)
pH: 6 (ref 5.0–8.0)

## 2018-01-30 LAB — LIPASE, BLOOD: LIPASE: 31 U/L (ref 11–51)

## 2018-01-30 MED ORDER — ORAL CARE MOUTH RINSE
15.0000 mL | Freq: Two times a day (BID) | OROMUCOSAL | Status: DC
  Administered 2018-01-31: 15 mL via OROMUCOSAL

## 2018-01-30 MED ORDER — METRONIDAZOLE IN NACL 5-0.79 MG/ML-% IV SOLN
500.0000 mg | Freq: Three times a day (TID) | INTRAVENOUS | Status: DC
  Administered 2018-01-30 – 2018-01-31 (×2): 500 mg via INTRAVENOUS
  Filled 2018-01-30 (×3): qty 100

## 2018-01-30 MED ORDER — ONDANSETRON 4 MG PO TBDP: 4.0000 mg | ORAL_TABLET | Freq: Four times a day (QID) | ORAL | Status: DC | PRN

## 2018-01-30 MED ORDER — PROMETHAZINE HCL 25 MG/ML IJ SOLN: 12.5000 mg | Freq: Four times a day (QID) | INTRAMUSCULAR | Status: DC | PRN

## 2018-01-30 MED ORDER — DIPHENHYDRAMINE HCL 50 MG/ML IJ SOLN: 12.5000 mg | Freq: Four times a day (QID) | INTRAMUSCULAR | Status: DC | PRN

## 2018-01-30 MED ORDER — CIPROFLOXACIN IN D5W 400 MG/200ML IV SOLN
400.0000 mg | Freq: Two times a day (BID) | INTRAVENOUS | Status: DC
  Administered 2018-01-30 – 2018-01-31 (×2): 400 mg via INTRAVENOUS
  Filled 2018-01-30 (×2): qty 200

## 2018-01-30 MED ORDER — CIPROFLOXACIN IN D5W 400 MG/200ML IV SOLN
400.0000 mg | Freq: Once | INTRAVENOUS | Status: DC
  Filled 2018-01-30: qty 200

## 2018-01-30 MED ORDER — SODIUM CHLORIDE 0.9 % IV SOLN
1.0000 g | Freq: Three times a day (TID) | INTRAVENOUS | Status: DC
  Filled 2018-01-30 (×2): qty 1

## 2018-01-30 MED ORDER — KCL IN DEXTROSE-NACL 20-5-0.45 MEQ/L-%-% IV SOLN
INTRAVENOUS | Status: DC
  Administered 2018-01-30 – 2018-02-02 (×7): via INTRAVENOUS
  Filled 2018-01-30 (×7): qty 1000

## 2018-01-30 MED ORDER — ZOLPIDEM TARTRATE 5 MG PO TABS: 5.0000 mg | ORAL_TABLET | Freq: Every evening | ORAL | Status: DC | PRN

## 2018-01-30 MED ORDER — SODIUM CHLORIDE 0.9 % IV BOLUS
1000.0000 mL | Freq: Once | INTRAVENOUS | Status: AC
  Administered 2018-01-30: 1000 mL via INTRAVENOUS

## 2018-01-30 MED ORDER — ONDANSETRON HCL 4 MG/2ML IJ SOLN
4.0000 mg | Freq: Once | INTRAMUSCULAR | Status: AC
  Administered 2018-01-30: 4 mg via INTRAVENOUS
  Filled 2018-01-30: qty 2

## 2018-01-30 MED ORDER — MORPHINE SULFATE (PF) 4 MG/ML IV SOLN
1.0000 mg | INTRAVENOUS | Status: DC | PRN
  Administered 2018-01-30 – 2018-02-02 (×7): 2 mg via INTRAVENOUS
  Filled 2018-01-30 (×7): qty 1

## 2018-01-30 MED ORDER — CIPROFLOXACIN IN D5W 400 MG/200ML IV SOLN: 400.0000 mg | Freq: Two times a day (BID) | INTRAVENOUS | Status: DC

## 2018-01-30 MED ORDER — ACETAMINOPHEN 325 MG PO TABS
650.0000 mg | ORAL_TABLET | Freq: Four times a day (QID) | ORAL | Status: DC | PRN
  Administered 2018-02-01: 650 mg via ORAL
  Filled 2018-01-30 (×2): qty 2

## 2018-01-30 MED ORDER — MORPHINE SULFATE (PF) 4 MG/ML IV SOLN
2.0000 mg | Freq: Once | INTRAVENOUS | Status: AC
  Administered 2018-01-30: 2 mg via INTRAVENOUS
  Filled 2018-01-30: qty 1

## 2018-01-30 MED ORDER — DIPHENHYDRAMINE HCL 12.5 MG/5ML PO ELIX: 12.5000 mg | ORAL_SOLUTION | Freq: Four times a day (QID) | ORAL | Status: DC | PRN

## 2018-01-30 MED ORDER — ONDANSETRON HCL 4 MG/2ML IJ SOLN: 4.0000 mg | Freq: Four times a day (QID) | INTRAMUSCULAR | Status: DC | PRN

## 2018-01-30 MED ORDER — IOHEXOL 300 MG/ML  SOLN
100.0000 mL | Freq: Once | INTRAMUSCULAR | Status: AC | PRN
  Administered 2018-01-30: 100 mL via INTRAVENOUS

## 2018-01-30 MED ORDER — HYDROMORPHONE HCL 2 MG/ML IJ SOLN
1.0000 mg | INTRAMUSCULAR | Status: DC | PRN
  Filled 2018-01-30 (×2): qty 1

## 2018-01-30 MED ORDER — ENOXAPARIN SODIUM 40 MG/0.4ML ~~LOC~~ SOLN
40.0000 mg | SUBCUTANEOUS | Status: DC
  Administered 2018-01-30 – 2018-01-31 (×2): 40 mg via SUBCUTANEOUS
  Filled 2018-01-30 (×3): qty 0.4

## 2018-01-30 MED ORDER — METRONIDAZOLE IN NACL 5-0.79 MG/ML-% IV SOLN
500.0000 mg | Freq: Once | INTRAVENOUS | Status: AC
  Administered 2018-01-30: 500 mg via INTRAVENOUS
  Filled 2018-01-30: qty 100

## 2018-01-30 MED ORDER — FAMOTIDINE IN NACL 20-0.9 MG/50ML-% IV SOLN
20.0000 mg | Freq: Two times a day (BID) | INTRAVENOUS | Status: DC
  Administered 2018-01-30 – 2018-02-02 (×6): 20 mg via INTRAVENOUS
  Filled 2018-01-30 (×6): qty 50

## 2018-01-30 MED ORDER — ACETAMINOPHEN 650 MG RE SUPP
650.0000 mg | Freq: Four times a day (QID) | RECTAL | Status: DC | PRN
  Administered 2018-01-31: 650 mg via RECTAL
  Filled 2018-01-30: qty 1

## 2018-01-30 MED ORDER — ACETAMINOPHEN 650 MG RE SUPP
650.0000 mg | Freq: Once | RECTAL | Status: AC
  Administered 2018-01-30: 650 mg via RECTAL
  Filled 2018-01-30: qty 1

## 2018-01-30 NOTE — ED Triage Notes (Signed)
Pt. Stated, I started having some stomach pain on Thursday and its just gotten worse. Denies any N/V or diarrhea. Pt. Took Tums, Pepto Bismol, no help

## 2018-01-30 NOTE — ED Provider Notes (Signed)
[facility] Provider Note   CSN: [id number] Arrival date & time: 01/30/18  1009     History   Chief Complaint Chief Complaint  Patient presents with  . Abdominal Pain    HPI Heather Ochoa is a 62 y.o. female with a hx of GERD, hypercholesterolemia, and umbilical hernia repair as a child who presents to the ED with complaint of abdominal pain that started 3 days ago. Patient describes the pain as cramping and gas like to the generalized abdomen. States it has been constant, progressively worsening. She relays that she has tried Tums and PeptoBismol with some relief, but not resolution. No specific aggravating factors. She states today she felt she had a fairly quick increase in her discomfort that seemed to radiate through the entire abdomen, given this with general progressive worsening she decided to come to the ER. She rates her current pain a 6/10 in severity. Denies fever, chills, nausea, vomiting, dysuria, vaginal bleeding/dicharge, diarrhea, constipation, or blood in stool. She had a normal BM this AM.   HPI  Past Medical History:  Diagnosis Date  . GERD (gastroesophageal reflux disease)    Dr. Collene Mares is GI  . Hypercholesterolemia   . Uterine leiomyoma     Patient Active Problem List   Diagnosis Date Noted  . Ankle clicking [id number]/5638    Past Surgical History:  Procedure Laterality Date  . TONSILLECTOMY    . UMBILICAL HERNIA REPAIR       OB History   None      Home Medications    Prior to Admission medications   Not on File    Family History Family History  Problem Relation Age of Onset  . Congestive Heart Failure Mother   . Dementia Mother   . Pneumonia Father   . Breast cancer Paternal Grandmother     Social History Social History   Tobacco Use  . Smoking status: Never Smoker  . Smokeless tobacco: Never Used  Substance Use Topics  . Alcohol use: Yes    Alcohol/week: 0.0 oz    Comment: 5 glasses of wine a  week  . Drug use: No     Allergies   Penicillins   Review of Systems Review of Systems  Constitutional: Negative for chills and fever.  Respiratory: Negative for shortness of breath.   Cardiovascular: Negative for chest pain.  Gastrointestinal: Positive for abdominal pain. Negative for blood in stool, constipation, diarrhea, nausea and vomiting.  Genitourinary: Negative for dysuria, vaginal bleeding and vaginal discharge.  All other systems reviewed and are negative.  Physical Exam Updated Vital Signs BP (!) 115/93 (BP Location: Right Arm)   Pulse 89   Temp 98.7 F (37.1 C) (Oral)   Resp 16   Ht 5\' 8"  (1.727 m)   Wt 90.7 kg (200 lb)   LMP 04/02/2016   SpO2 100%   BMI 30.41 kg/m   Physical Exam  Constitutional: She appears well-developed and well-nourished. She appears distressed (mild secondary to discomfort.).  HENT:  Head: Normocephalic and atraumatic.  Eyes: Conjunctivae are normal. Right eye exhibits no discharge. Left eye exhibits no discharge.  Cardiovascular: Normal rate and regular rhythm.  No murmur heard. Pulmonary/Chest: Breath sounds normal. No respiratory distress. She has no wheezes. She has no rales.  Abdominal: Soft. She exhibits no distension. There is generalized tenderness. There is guarding. There is no rigidity and no rebound.  Neurological: She is alert.  Clear speech.   Skin: Skin is warm and  dry. No rash noted.  Psychiatric: She has a normal mood and affect. Her behavior is normal.  Nursing note and vitals reviewed.   ED Treatments / Results  Labs Results for orders placed or performed during the hospital encounter of 01/30/18  Lipase, blood  Result Value Ref Range   Lipase 31 11 - 51 U/L  Comprehensive metabolic panel  Result Value Ref Range   Sodium 132 (L) 135 - 145 mmol/L   Potassium 3.8 3.5 - 5.1 mmol/L   Chloride 96 (L) 101 - 111 mmol/L   CO2 22 22 - 32 mmol/L   Glucose, Bld 129 (H) 65 - 99 mg/dL   BUN 9 6 - 20 mg/dL    Creatinine, Ser 0.97 0.44 - 1.00 mg/dL   Calcium 9.6 8.9 - 10.3 mg/dL   Total Protein 7.3 6.5 - 8.1 g/dL   Albumin 4.1 3.5 - 5.0 g/dL   AST 20 15 - 41 U/L   ALT 25 14 - 54 U/L   Alkaline Phosphatase 76 38 - 126 U/L   Total Bilirubin 1.2 0.3 - 1.2 mg/dL   GFR calc non Af Amer >60 >60 mL/min   GFR calc Af Amer >60 >60 mL/min   Anion gap 14 5 - 15  CBC  Result Value Ref Range   WBC 14.6 (H) 4.0 - 10.5 K/uL   RBC 5.22 (H) 3.87 - 5.11 MIL/uL   Hemoglobin 15.7 (H) 12.0 - 15.0 g/dL   HCT 46.3 (H) 36.0 - 46.0 %   MCV 88.7 78.0 - 100.0 fL   MCH 30.1 26.0 - 34.0 pg   MCHC 33.9 30.0 - 36.0 g/dL   RDW 14.0 11.5 - 15.5 %   Platelets 278 150 - 400 K/uL  Urinalysis, Routine w reflex microscopic  Result Value Ref Range   Color, Urine YELLOW YELLOW   APPearance HAZY (A) CLEAR   Specific Gravity, Urine 1.016 1.005 - 1.030   pH 6.0 5.0 - 8.0   Glucose, UA NEGATIVE NEGATIVE mg/dL   Hgb urine dipstick NEGATIVE NEGATIVE   Bilirubin Urine NEGATIVE NEGATIVE   Ketones, ur NEGATIVE NEGATIVE mg/dL   Protein, ur NEGATIVE NEGATIVE mg/dL   Nitrite NEGATIVE NEGATIVE   Leukocytes, UA NEGATIVE NEGATIVE   EKG None  Radiology Ct Abdomen Pelvis W Contrast  Result Date: 01/30/2018 CLINICAL DATA:  62 year old female with abdominal pain since Thursday, progressively worsening. No associated nausea, vomiting or diarrhea. EXAM: CT ABDOMEN AND PELVIS WITH CONTRAST TECHNIQUE: Multidetector CT imaging of the abdomen and pelvis was performed using the standard protocol following bolus administration of intravenous contrast. CONTRAST:  116mL OMNIPAQUE IOHEXOL 300 MG/ML  SOLN COMPARISON:  None. FINDINGS: Lower chest: Unremarkable. Hepatobiliary: No definite cystic or solid hepatic lesions. No intra or extrahepatic biliary ductal dilatation. Gallbladder is normal in appearance. Pancreas: No pancreatic mass. No pancreatic ductal dilatation. No pancreatic or peripancreatic fluid or inflammatory changes. Spleen: Unremarkable.  Adrenals/Urinary Tract: Bilateral kidneys and bilateral adrenal glands are normal in appearance. No hydroureteronephrosis. Urinary bladder is normal in appearance. Stomach/Bowel: Stomach is normal in appearance. No pathologic dilatation of small bowel or colon. Several scattered colonic diverticulae are noted. Importantly, in the region of the mid sigmoid colon there are surrounding inflammatory changes in the associated mesocolon, as well as extraluminal gas best appreciated on axial image 51 of series 3, compatible with an acute diverticulitis. This extraluminal gas collection also has some associated fluid component measuring overall 3.2 x 1.4 x 2.4 cm (axial image 51 of series 3  and coronal image 74 of series 6), compatible with a small diverticular abscess. This has presumably perforated given the small volume of pneumoperitoneum. Normal appendix. Vascular/Lymphatic: No significant atherosclerotic disease, aneurysm or dissection noted in the abdominal or pelvic vasculature. No lymphadenopathy noted in the abdomen or pelvis. Reproductive: Multiple uterine lesions are noted demonstrating heterogeneous attenuation, presumably multifocal fibroids. The largest of these measures up to 5.5 cm in diameter in the fundus. Ovaries are unremarkable in appearance. Other: Trace volume of ascites. Small amount of pneumoperitoneum scattered throughout the peritoneal cavity. Musculoskeletal: There are no aggressive appearing lytic or blastic lesions noted in the visualized portions of the skeleton. IMPRESSION: 1. Findings are compatible with an acute diverticulitis in the mid sigmoid colon with diverticular abscess and signs of frank perforation. Emergent surgical consultation is strongly recommended. 2. Fibroid uterus, as above. 3. Additional incidental findings, as above. Critical Value/emergent results were called by telephone at the time of interpretation on 01/30/2018 at 2:27 pm to [place], who verbally  acknowledged these results. Electronically Signed   By: Vinnie Langton M.D.   On: 01/30/2018 14:37    Procedures Procedures (including critical care time)  Medications Ordered in ED Medications  metroNIDAZOLE (FLAGYL) IVPB 500 mg (has no administration in time range)  meropenem (MERREM) 1 g in sodium chloride 0.9 % 100 mL IVPB (has no administration in time range)  acetaminophen (TYLENOL) suppository 650 mg (has no administration in time range)  sodium chloride 0.9 % bolus 1,000 mL (0 mLs Intravenous Stopped 01/30/18 1500)  ondansetron (ZOFRAN) injection 4 mg (4 mg Intravenous Given 01/30/18 1311)  morphine 4 MG/ML injection 2 mg (2 mg Intravenous Given 01/30/18 1311)  iohexol (OMNIPAQUE) 300 MG/ML solution 100 mL (100 mLs Intravenous Contrast Given 01/30/18 1349)  metroNIDAZOLE (FLAGYL) IVPB 500 mg (500 mg Intravenous New Bag/Given 01/30/18 1457)    Initial Impression / Assessment and Plan / ED Course  I have reviewed the triage vital signs and the nursing notes.  Pertinent labs & imaging results that were available during my care of the patient were reviewed by me and considered in my medical decision making (see chart for details).   Patient presents with abdominal pain. Patient appears uncomfortable. Initial vitals- afebrile. Exam notable for diffuse tenderness and mild guarding. Labs per triage notable for leukocytosis at 14.6. Mild hyponatremia 132 and hypochloremia at 96. Hyperglycemia at 129. Lipase WNL, LFTs and renal fxn WNL. DDX: diverticulitis, bowel obstruction, bowel perforation, appendicitis. Will obtain CT abdomen/pelvis and initiate tx with fluids, morphine, and zofran.   14:28: CONSULT: Discussed case with radiologist Dr. Weber Cooks regarding CT results- patient has diverticulitis with perforation and abscess formation.   ABX ordered utilizing ED abx order set with PCN allergy (Cipro/Flagyl/Gentamicin). General surgery consult placed.  Discussed with pharmacist- asked to DC  gentamicin.   14:51: Repeat vitals notable for fever to 102.7, patient with mild tachycardia at 102. Discussed with Dr. Eulis Foster- Abx have been started and therefore blood cultures for possible sepsis will not be obtained. Will order suppository Tylenol.  15:09: CONSULT: Discussed with general surgeon Dr. Redmond Pulling- requesting alternative regimen for abx, specifically for ciprofloxacin alternative- instructs pharmacy consultation. Will come to see the patient in the ER.   15:15: CONSULT: Discussed with pharmacist Larene Beach regarding abx selection- recommends flagyl and meropenem- orders placed.   16:00: Patient has been seen by surgeon Dr. Redmond Pulling- plan for medical management. After evaluation of the patient he feels that we can DC meropenem and utilize ciprofloxacin- he will  place orders and admit the patient.   Findings and plan of care discussed with supervising physician Dr. Eulis Foster who is in agreement with plan.   Final Clinical Impressions(s) / ED Diagnoses   Final diagnoses:  Diverticulitis    ED Discharge Orders    None       Amaryllis Dyke, PA-C 01/30/18 1737    Daleen Bo, MD 01/30/18 2153

## 2018-01-30 NOTE — H&P (Signed)
Heather Ochoa is an 62 y.o. female.   Chief Complaint: abdominal pain HPI: 62 year old obese white female came to the emergency room because of worsening periumbilical pain.  She states that she started having stomach pains on Thursday.  She describes it as a stomachache.  It was more or less constant.  She tried Tums and Pepto-Bismol without relief.  She was able to eat while she had a stomachache but did not feel well.  Early this morning she had severe periumbilical pain causing her to double over so she came to the emergency room.  She denies any fevers at home, nausea, vomiting, diarrhea or constipation.  She had several small bowel movements this morning.  She had a prior colonoscopy in 2012 by Dr. Collene Mares which was unremarkable except for single diverticulum.  She denies any weight loss.  She denies any dysuria or pneumaturia.  She does not smoke.  She drinks 2 glasses of wine per week.  She works for [employer]  She states that she had an umbilical hernia repair at age 69.  She does not believe the use mesh.  Past Medical History:  Diagnosis Date  . GERD (gastroesophageal reflux disease)    Dr. Collene Mares is GI  . Hypercholesterolemia   . Uterine leiomyoma     Past Surgical History:  Procedure Laterality Date  . TONSILLECTOMY    . UMBILICAL HERNIA REPAIR      Family History  Problem Relation Age of Onset  . Congestive Heart Failure Mother   . Dementia Mother   . Pneumonia Father   . Breast cancer Paternal Grandmother    Social History:  reports that she has never smoked. She has never used smokeless tobacco. She reports that she drinks alcohol. She reports that she does not use drugs.  Allergies:  Allergies  Allergen Reactions  . Penicillins Other (See Comments)    Has patient had a PCN reaction causing immediate rash, facial/tongue/throat swelling, SOB or lightheadedness with hypotension: Unknown Has patient had a PCN reaction causing severe rash involving mucus membranes or  skin necrosis: Unknown Has patient had a PCN reaction that required hospitalization: Unknown Has patient had a PCN reaction occurring within the last 10 years: No If all of the above answers are "NO", then may proceed with Cephalosporin use.   Childhood reaction      (Not in a hospital admission)  Results for orders placed or performed during the hospital encounter of 01/30/18 (from the past 48 hour(s))  Lipase, blood     Status: None   Collection Time: 01/30/18 10:31 AM  Result Value Ref Range   Lipase 31 11 - 51 U/L    Comment: Performed at [facility], [address] [place],  [address]  Comprehensive metabolic panel     Status: Abnormal   Collection Time: 01/30/18 10:31 AM  Result Value Ref Range   Sodium 132 (L) 135 - 145 mmol/L   Potassium 3.8 3.5 - 5.1 mmol/L   Chloride 96 (L) 101 - 111 mmol/L   CO2 22 22 - 32 mmol/L   Glucose, Bld 129 (H) 65 - 99 mg/dL   BUN 9 6 - 20 mg/dL   Creatinine, Ser 0.97 0.44 - 1.00 mg/dL   Calcium 9.6 8.9 - 10.3 mg/dL   Total Protein 7.3 6.5 - 8.1 g/dL   Albumin 4.1 3.5 - 5.0 g/dL   AST 20 15 - 41 U/L   ALT 25 14 - 54 U/L   Alkaline  Phosphatase 76 38 - 126 U/L   Total Bilirubin 1.2 0.3 - 1.2 mg/dL   GFR calc non Af Amer >60 >60 mL/min   GFR calc Af Amer >60 >60 mL/min    Comment: (NOTE) The eGFR has been calculated using the CKD EPI equation. This calculation has not been validated in all clinical situations. eGFR's persistently <60 mL/min signify possible Chronic Kidney Disease.    Anion gap 14 5 - 15    Comment: Performed at [facility] [address], [place], [place] [address]  CBC     Status: Abnormal   Collection Time: 01/30/18 10:31 AM  Result Value Ref Range   WBC 14.6 (H) 4.0 - 10.5 K/uL   RBC 5.22 (H) 3.87 - 5.11 MIL/uL   Hemoglobin 15.7 (H) 12.0 - 15.0 g/dL   HCT 46.3 (H) 36.0 - 46.0 %   MCV 88.7 78.0 - 100.0 fL   MCH 30.1 26.0 - 34.0 pg   MCHC 33.9 30.0 - 36.0 g/dL   RDW 14.0 11.5 - 15.5 %    Platelets 278 150 - 400 K/uL    Comment: Performed at [facility], [place] [address] [place], [place] [address]  Urinalysis, Routine w reflex microscopic     Status: Abnormal   Collection Time: 01/30/18 10:34 AM  Result Value Ref Range   Color, Urine YELLOW YELLOW   APPearance HAZY (A) CLEAR   Specific Gravity, Urine 1.016 1.005 - 1.030   pH 6.0 5.0 - 8.0   Glucose, UA NEGATIVE NEGATIVE mg/dL   Hgb urine dipstick NEGATIVE NEGATIVE   Bilirubin Urine NEGATIVE NEGATIVE   Ketones, ur NEGATIVE NEGATIVE mg/dL   Protein, ur NEGATIVE NEGATIVE mg/dL   Nitrite NEGATIVE NEGATIVE   Leukocytes, UA NEGATIVE NEGATIVE    Comment: Performed at [facility] [address] [place], [place] [address]   Ct Abdomen Pelvis W Contrast  Result Date: 01/30/2018 CLINICAL DATA:  62 year old female with abdominal pain since Thursday, progressively worsening. No associated nausea, vomiting or diarrhea. EXAM: CT ABDOMEN AND PELVIS WITH CONTRAST TECHNIQUE: Multidetector CT imaging of the abdomen and pelvis was performed using the standard protocol following bolus administration of intravenous contrast. CONTRAST:  173m OMNIPAQUE IOHEXOL 300 MG/ML  SOLN COMPARISON:  None. FINDINGS: Lower chest: Unremarkable. Hepatobiliary: No definite cystic or solid hepatic lesions. No intra or extrahepatic biliary ductal dilatation. Gallbladder is normal in appearance. Pancreas: No pancreatic mass. No pancreatic ductal dilatation. No pancreatic or peripancreatic fluid or inflammatory changes. Spleen: Unremarkable. Adrenals/Urinary Tract: Bilateral kidneys and bilateral adrenal glands are normal in appearance. No hydroureteronephrosis. Urinary bladder is normal in appearance. Stomach/Bowel: Stomach is normal in appearance. No pathologic dilatation of small bowel or colon. Several scattered colonic diverticulae are noted. Importantly, in the region of the mid sigmoid colon there are surrounding inflammatory changes in the associated  mesocolon, as well as extraluminal gas best appreciated on axial image 51 of series 3, compatible with an acute diverticulitis. This extraluminal gas collection also has some associated fluid component measuring overall 3.2 x 1.4 x 2.4 cm (axial image 51 of series 3 and coronal image 74 of series 6), compatible with a small diverticular abscess. This has presumably perforated given the small volume of pneumoperitoneum. Normal appendix. Vascular/Lymphatic: No significant atherosclerotic disease, aneurysm or dissection noted in the abdominal or pelvic vasculature. No lymphadenopathy noted in the abdomen or pelvis. Reproductive: Multiple uterine lesions are noted demonstrating heterogeneous attenuation, presumably multifocal fibroids. The largest of these measures  up to 5.5 cm in diameter in the fundus. Ovaries are unremarkable in appearance. Other: Trace volume of ascites. Small amount of pneumoperitoneum scattered throughout the peritoneal cavity. Musculoskeletal: There are no aggressive appearing lytic or blastic lesions noted in the visualized portions of the skeleton. IMPRESSION: 1. Findings are compatible with an acute diverticulitis in the mid sigmoid colon with diverticular abscess and signs of frank perforation. Emergent surgical consultation is strongly recommended. 2. Fibroid uterus, as above. 3. Additional incidental findings, as above. Critical Value/emergent results were called by telephone at the time of interpretation on 01/30/2018 at 2:27 pm to [place], who verbally acknowledged these results. Electronically Signed   By: Vinnie Langton M.D.   On: 01/30/2018 14:37    Review of Systems  Constitutional: Positive for fever. Negative for chills and weight loss.  HENT: Negative for nosebleeds.   Eyes: Negative for blurred vision.  Respiratory: Negative for shortness of breath.   Cardiovascular: Negative for chest pain, palpitations, orthopnea and PND.       Denies DOE    Gastrointestinal: Positive for abdominal pain. Negative for blood in stool, constipation, diarrhea, heartburn, melena and vomiting.  Genitourinary: Negative for dysuria and hematuria.  Musculoskeletal: Negative.   Skin: Negative for itching and rash.  Neurological: Negative for dizziness, focal weakness, seizures, loss of consciousness and headaches.       Denies TIAs, amaurosis fugax  Endo/Heme/Allergies: Does not bruise/bleed easily.  Psychiatric/Behavioral: The patient is not nervous/anxious.     Blood pressure 123/78, pulse (!) 102, temperature (!) 102.7 F (39.3 C), temperature source Oral, resp. rate 18, height '5\' 8"'$  (1.727 m), weight 90.7 kg (200 lb), last menstrual period 04/02/2016, SpO2 98 %. Physical Exam  Vitals reviewed. Constitutional: She is oriented to person, place, and time. She appears well-developed and well-nourished.  Non-toxic appearance. No distress.  HENT:  Head: Normocephalic and atraumatic.  Right Ear: External ear normal.  Left Ear: External ear normal.  Eyes: Conjunctivae are normal. No scleral icterus.  Neck: Normal range of motion. Neck supple. No tracheal deviation present. No thyromegaly present.  Cardiovascular: Normal rate and normal heart sounds.  Respiratory: Effort normal and breath sounds normal. No stridor. No respiratory distress. She has no wheezes.  GI: Soft. Normal appearance. There is tenderness in the periumbilical area and left lower quadrant. There is guarding (voluntary). There is no rigidity and no rebound.    Musculoskeletal: She exhibits no edema or tenderness.  Lymphadenopathy:    She has no cervical adenopathy.  Neurological: She is alert and oriented to person, place, and time. She exhibits normal muscle tone.  Skin: Skin is warm and dry. No rash noted. She is not diaphoretic. No erythema. No pallor.  Psychiatric: She has a normal mood and affect. Her behavior is normal. Judgment and thought content normal.      Assessment/Plan Sigmoid diverticulitis with abscess Obesity History of GERD  She does not have peritonitis on exam.  She is resting comfortably.  She is nontoxic-appearing.  I do not believe she needs urgent surgical evaluation.  Her abscess is small and does not appear amenable to percutaneous drain.  I had a long conversation with the patient and her husband regarding diverticulitis and its management.  We discussed that hopefully medical management will work and that she can be advanced to a diet in the next several days with conversion to oral antibiotics with transition to home.  We did discuss the possibility that her diverticulitis could worsen requiring a change  in the plan.  We also discussed the possibility of smoldering diverticulitis.  Admit IV abx - will start with cipro/flagyl.  Will change abx if doesn't repsond VTE prophylaxis Npo xcept ice, sips of water  Greer Pickerel, MD 01/30/2018, 4:13 PM

## 2018-01-30 NOTE — ED Notes (Signed)
ED Provider at bedside. 

## 2018-01-30 NOTE — Progress Notes (Signed)
Pt admitted to 6N30 from ED via stretcher.  Pt is not having any pain at present.  Oriented to the room and dept. Explained to pt that she should not eat or drink anything.

## 2018-01-31 ENCOUNTER — Other Ambulatory Visit: Payer: Self-pay

## 2018-01-31 LAB — BASIC METABOLIC PANEL
ANION GAP: 10 (ref 5–15)
BUN: 8 mg/dL (ref 6–20)
CHLORIDE: 100 mmol/L — AB (ref 101–111)
CO2: 21 mmol/L — ABNORMAL LOW (ref 22–32)
Calcium: 8.3 mg/dL — ABNORMAL LOW (ref 8.9–10.3)
Creatinine, Ser: 0.93 mg/dL (ref 0.44–1.00)
GFR calc Af Amer: >60 mL/min (ref >60)
Glucose, Bld: 135 mg/dL — ABNORMAL HIGH (ref 65–99)
POTASSIUM: 3.6 mmol/L (ref 3.5–5.1)
SODIUM: 131 mmol/L — AB (ref 135–145)

## 2018-01-31 LAB — CBC
HEMATOCRIT: 37.5 % (ref 36.0–46.0)
HEMOGLOBIN: 12.2 g/dL (ref 12.0–15.0)
MCH: 28.9 pg (ref 26.0–34.0)
MCHC: 32.5 g/dL (ref 30.0–36.0)
MCV: 88.9 fL (ref 78.0–100.0)
Platelets: 213 10*3/uL (ref 150–400)
RBC: 4.22 MIL/uL (ref 3.87–5.11)
RDW: 14 % (ref 11.5–15.5)
WBC: 15.8 10*3/uL — AB (ref 4.0–10.5)

## 2018-01-31 MED ORDER — SODIUM CHLORIDE 0.9 % IV SOLN
1.0000 g | Freq: Three times a day (TID) | INTRAVENOUS | Status: DC
  Administered 2018-01-31 – 2018-02-03 (×10): 1 g via INTRAVENOUS
  Filled 2018-01-31 (×11): qty 1

## 2018-01-31 NOTE — Progress Notes (Signed)
[facility]/Trauma Progress Note      Assessment/Plan Obesity History of GERD  Sigmoid diverticulitis with abscess - no peritonitis, pain improved, no indication for emergent surgical intervention at this time - abscess is small and not likely amenable to drain  FEN: clears VTE: SCD's, lovenox ID: Cipro & Flagyl 05/04-05/05; meropenem 05/05>>   WBC 15.8, Tmax 100.6 @ 1830 05/04 Foley: none Follow up: TBD   DISPO: Pain greatly improved. Will start on clears    LOS: 1 day    Subjective: CC: abdominal pain  Pt states pain is greatly improved since admission. No chills, nausea or vomiting overnight. Tmax 100.6 @ 1830 05/04 No family at bedside. No new complaints.  Objective: Vital signs in last 24 hours: Temp:  [98.1 F (36.7 C)-103.2 F (39.6 C)] 100.3 F (37.9 C) (05/05 0547) Pulse Rate:  [80-108] 80 (05/05 0547) Resp:  [16-20] 17 (05/05 0547) BP: (96-123)/(64-93) 96/64 (05/05 0547) SpO2:  [94 %-100 %] 96 % (05/05 0547) Weight:  [90.7 kg (200 lb)] 90.7 kg (200 lb) (05/04 1025) Last BM Date: 01/30/18  Intake/Output from previous day: 05/04 0701 - 05/05 0700 In: 1540 [P.O.:240; I.V.:1000; IV Piggyback:300] Out: 300 [Urine:300] Intake/Output this shift: No intake/output data recorded.  PE: Gen:  Alert, NAD, pleasant, cooperative Card:  RRR, no M/G/R heard Pulm:  CTA, no W/R/R, effort normal Abd: Soft, ND, +BS, no HSM, very mild TTP of lower abdomen, no guarding, no signs of peritonitis Skin: no rashes noted, warm and dry   Anti-infectives: Anti-infectives (From admission, onward)   Start     Dose/Rate Route Frequency Ordered Stop   01/31/18 0930  meropenem (MERREM) 1 g in sodium chloride 0.9 % 100 mL IVPB     1 g 200 mL/hr over 30 Minutes Intravenous Every 8 hours 01/31/18 0830     01/31/18 0300  ciprofloxacin (CIPRO) IVPB 400 mg  Status:  Discontinued     400 mg 200 mL/hr over 60 Minutes Intravenous Every 12 hours 01/30/18 1448 01/30/18 1519    01/30/18 2300  metroNIDAZOLE (FLAGYL) IVPB 500 mg  Status:  Discontinued     500 mg 100 mL/hr over 60 Minutes Intravenous Every 8 hours 01/30/18 1448 01/31/18 0816   01/30/18 1615  ciprofloxacin (CIPRO) IVPB 400 mg  Status:  Discontinued     400 mg 200 mL/hr over 60 Minutes Intravenous Every 12 hours 01/30/18 1613 01/31/18 0816   01/30/18 1600  meropenem (MERREM) 1 g in sodium chloride 0.9 % 100 mL IVPB  Status:  Discontinued     1 g 200 mL/hr over 30 Minutes Intravenous Every 8 hours 01/30/18 1519 01/30/18 1612   01/30/18 1445  ciprofloxacin (CIPRO) IVPB 400 mg  Status:  Discontinued     400 mg 200 mL/hr over 60 Minutes Intravenous  Once 01/30/18 1437 01/30/18 1517   01/30/18 1445  metroNIDAZOLE (FLAGYL) IVPB 500 mg     500 mg 100 mL/hr over 60 Minutes Intravenous  Once 01/30/18 1437 01/30/18 1557      Lab Results:  Recent Labs    01/30/18 1031 01/31/18 0340  WBC 14.6* 15.8*  HGB 15.7* 12.2  HCT 46.3* 37.5  PLT 278 213   BMET Recent Labs    01/30/18 1031 01/31/18 0340  NA 132* 131*  K 3.8 3.6  CL 96* 100*  CO2 22 21*  GLUCOSE 129* 135*  BUN 9 8  CREATININE 0.97 0.93  CALCIUM 9.6 8.3*   PT/INR No results for input(s): LABPROT, INR  in the last 72 hours. CMP     Component Value Date/Time   NA 131 (L) 01/31/2018 0340   K 3.6 01/31/2018 0340   CL 100 (L) 01/31/2018 0340   CO2 21 (L) 01/31/2018 0340   GLUCOSE 135 (H) 01/31/2018 0340   BUN 8 01/31/2018 0340   CREATININE 0.93 01/31/2018 0340   CREATININE 0.78 05/14/2017 0939   CALCIUM 8.3 (L) 01/31/2018 0340   PROT 7.3 01/30/2018 1031   ALBUMIN 4.1 01/30/2018 1031   AST 20 01/30/2018 1031   ALT 25 01/30/2018 1031   ALKPHOS 76 01/30/2018 1031   BILITOT 1.2 01/30/2018 1031   GFRNONAA >60 01/31/2018 0340   GFRAA >60 01/31/2018 0340   Lipase     Component Value Date/Time   LIPASE 31 01/30/2018 1031    Studies/Results: Ct Abdomen Pelvis W Contrast  Result Date: 01/30/2018 CLINICAL DATA:  62 year old  female with abdominal pain since Thursday, progressively worsening. No associated nausea, vomiting or diarrhea. EXAM: CT ABDOMEN AND PELVIS WITH CONTRAST TECHNIQUE: Multidetector CT imaging of the abdomen and pelvis was performed using the standard protocol following bolus administration of intravenous contrast. CONTRAST:  166mL OMNIPAQUE IOHEXOL 300 MG/ML  SOLN COMPARISON:  None. FINDINGS: Lower chest: Unremarkable. Hepatobiliary: No definite cystic or solid hepatic lesions. No intra or extrahepatic biliary ductal dilatation. Gallbladder is normal in appearance. Pancreas: No pancreatic mass. No pancreatic ductal dilatation. No pancreatic or peripancreatic fluid or inflammatory changes. Spleen: Unremarkable. Adrenals/Urinary Tract: Bilateral kidneys and bilateral adrenal glands are normal in appearance. No hydroureteronephrosis. Urinary bladder is normal in appearance. Stomach/Bowel: Stomach is normal in appearance. No pathologic dilatation of small bowel or colon. Several scattered colonic diverticulae are noted. Importantly, in the region of the mid sigmoid colon there are surrounding inflammatory changes in the associated mesocolon, as well as extraluminal gas best appreciated on axial image 51 of series 3, compatible with an acute diverticulitis. This extraluminal gas collection also has some associated fluid component measuring overall 3.2 x 1.4 x 2.4 cm (axial image 51 of series 3 and coronal image 74 of series 6), compatible with a small diverticular abscess. This has presumably perforated given the small volume of pneumoperitoneum. Normal appendix. Vascular/Lymphatic: No significant atherosclerotic disease, aneurysm or dissection noted in the abdominal or pelvic vasculature. No lymphadenopathy noted in the abdomen or pelvis. Reproductive: Multiple uterine lesions are noted demonstrating heterogeneous attenuation, presumably multifocal fibroids. The largest of these measures up to 5.5 cm in diameter in the  fundus. Ovaries are unremarkable in appearance. Other: Trace volume of ascites. Small amount of pneumoperitoneum scattered throughout the peritoneal cavity. Musculoskeletal: There are no aggressive appearing lytic or blastic lesions noted in the visualized portions of the skeleton. IMPRESSION: 1. Findings are compatible with an acute diverticulitis in the mid sigmoid colon with diverticular abscess and signs of frank perforation. Emergent surgical consultation is strongly recommended. 2. Fibroid uterus, as above. 3. Additional incidental findings, as above. Critical Value/emergent results were called by telephone at the time of interpretation on 01/30/2018 at 2:27 pm to [place], who verbally acknowledged these results. Electronically Signed   By: Vinnie Langton M.D.   On: 01/30/2018 14:37      Kalman Drape , [facility] 01/31/2018, 8:35 AM  Pager: [phone] Mon-Wed, Friday 7:00am-4:30pm Thurs 7am-11:30am  Consults: [phone]

## 2018-01-31 NOTE — Progress Notes (Signed)
ANTIBIOTIC CONSULT NOTE - INITIAL  Pharmacy Consult for Merrem Indication: Intraabdominal infection  Allergies  Allergen Reactions  . Penicillins Other (See Comments)    Has patient had a PCN reaction causing immediate rash, facial/tongue/throat swelling, SOB or lightheadedness with hypotension: Unknown Has patient had a PCN reaction causing severe rash involving mucus membranes or skin necrosis: Unknown Has patient had a PCN reaction that required hospitalization: Unknown Has patient had a PCN reaction occurring within the last 10 years: No If all of the above answers are "NO", then may proceed with Cephalosporin use.   Childhood reaction     Patient Measurements: Height: 5\' 8"  (172.7 cm) Weight: 200 lb (90.7 kg) IBW/kg (Calculated) : 63.9 Adjusted Body Weight:    Vital Signs: Temp: 100.3 F (37.9 C) (05/05 0547) Temp Source: Oral (05/05 0547) BP: 96/64 (05/05 0547) Pulse Rate: 80 (05/05 0547) Intake/Output from previous day: 05/04 0701 - 05/05 0700 In: 1540 [P.O.:240; I.V.:1000; IV Piggyback:300] Out: 300 [Urine:300] Intake/Output from this shift: No intake/output data recorded.  Labs: Recent Labs    01/30/18 1031 01/31/18 0340  WBC 14.6* 15.8*  HGB 15.7* 12.2  PLT 278 213  CREATININE 0.97 0.93   Estimated Creatinine Clearance: 76.7 mL/min (by C-G formula based on SCr of 0.93 mg/dL). No results for input(s): VANCOTROUGH, VANCOPEAK, VANCORANDOM, GENTTROUGH, GENTPEAK, GENTRANDOM, TOBRATROUGH, TOBRAPEAK, TOBRARND, AMIKACINPEAK, AMIKACINTROU, AMIKACIN in the last 72 hours.   Microbiology: No results found for this or any previous visit (from the past 720 hour(s)).  Medical History: Past Medical History:  Diagnosis Date  . GERD (gastroesophageal reflux disease)    Dr. Collene Mares is GI  . Hypercholesterolemia   . Uterine leiomyoma    Assessment: CC/HPI: Worsening stomach pain x several days  PMH:  GERD, hypercholesterolemia, and umbilical hernia repair as a  child, Uterine leiomyoma, obesity  ID: Intraabdominal infection. Tmax 103.2. WBC 15.8 up.  Goal of Therapy:  Eradication of infection  Plan:  Merrem 1g IV q 8hrs  Even Budlong S. Alford Highland, PharmD, BCPS Clinical Staff Pharmacist Pager [phone]  Eilene Ghazi Stillinger 01/31/2018,8:31 AM

## 2018-02-01 LAB — CBC
HCT: 37.1 % (ref 36.0–46.0)
Hemoglobin: 12.1 g/dL (ref 12.0–15.0)
MCH: 29.2 pg (ref 26.0–34.0)
MCHC: 32.6 g/dL (ref 30.0–36.0)
MCV: 89.4 fL (ref 78.0–100.0)
PLATELETS: 208 10*3/uL (ref 150–400)
RBC: 4.15 MIL/uL (ref 3.87–5.11)
RDW: 13.9 % (ref 11.5–15.5)
WBC: 13.5 10*3/uL — AB (ref 4.0–10.5)

## 2018-02-01 LAB — BASIC METABOLIC PANEL
ANION GAP: 8 (ref 5–15)
BUN: 6 mg/dL (ref 6–20)
CO2: 24 mmol/L (ref 22–32)
Calcium: 8.2 mg/dL — ABNORMAL LOW (ref 8.9–10.3)
Chloride: 102 mmol/L (ref 101–111)
Creatinine, Ser: 1 mg/dL (ref 0.44–1.00)
GFR calc Af Amer: >60 mL/min (ref >60)
Glucose, Bld: 116 mg/dL — ABNORMAL HIGH (ref 65–99)
POTASSIUM: 3.5 mmol/L (ref 3.5–5.1)
SODIUM: 134 mmol/L — AB (ref 135–145)

## 2018-02-01 NOTE — Progress Notes (Signed)
[facility] Progress Note     Subjective: CC- upper abdominal pain Patient states that she is feeling better than yesterday. Continues to have some upper abdominal pain and bloating. Started on clears yesterday which she tolerated well, this did not increase her pain. States that she did have a bout of increased pain around 2100 yesterday, no n/v.  WBC trending down, TMAX 100.7  Objective: Vital signs in last 24 hours: Temp:  [99.9 F (37.7 C)-100.7 F (38.2 C)] 100.7 F (38.2 C) (05/06 0448) Pulse Rate:  [84-89] 84 (05/06 0448) Resp:  [17] 17 (05/06 0448) BP: (98-114)/(60-65) 98/61 (05/06 0448) SpO2:  [92 %-96 %] 96 % (05/06 0448) Last BM Date: 02/01/18  Intake/Output from previous day: 05/05 0701 - 05/06 0700 In: 1200 [I.V.:1000; IV Piggyback:200] Out: 1700 [Urine:1700] Intake/Output this shift: No intake/output data recorded.  PE: Gen:  Alert, NAD, pleasant HEENT: EOM's intact, pupils equal and round Card:  RRR, no M/G/R heard Pulm:  CTAB, no W/R/R, effort normal Abd: Soft, mild distension, +BS, no HSM, no hernia, well healed periumbilical incision, TTP upper abdominal quadrants without rebound or guarding Ext:  Calves soft and nontender Psych: A&Ox3  Skin: no rashes noted, warm and dry  Lab Results:  Recent Labs    01/31/18 0340 02/01/18 0354  WBC 15.8* 13.5*  HGB 12.2 12.1  HCT 37.5 37.1  PLT 213 208   BMET Recent Labs    01/31/18 0340 02/01/18 0354  NA 131* 134*  K 3.6 3.5  CL 100* 102  CO2 21* 24  GLUCOSE 135* 116*  BUN 8 6  CREATININE 0.93 1.00  CALCIUM 8.3* 8.2*   PT/INR No results for input(s): LABPROT, INR in the last 72 hours. CMP     Component Value Date/Time   NA 134 (L) 02/01/2018 0354   K 3.5 02/01/2018 0354   CL 102 02/01/2018 0354   CO2 24 02/01/2018 0354   GLUCOSE 116 (H) 02/01/2018 0354   BUN 6 02/01/2018 0354   CREATININE 1.00 02/01/2018 0354   CREATININE 0.78 05/14/2017 0939   CALCIUM 8.2 (L) 02/01/2018  0354   PROT 7.3 01/30/2018 1031   ALBUMIN 4.1 01/30/2018 1031   AST 20 01/30/2018 1031   ALT 25 01/30/2018 1031   ALKPHOS 76 01/30/2018 1031   BILITOT 1.2 01/30/2018 1031   GFRNONAA >60 02/01/2018 0354   GFRAA >60 02/01/2018 0354   Lipase     Component Value Date/Time   LIPASE 31 01/30/2018 1031       Studies/Results: Ct Abdomen Pelvis W Contrast  Result Date: 01/30/2018 CLINICAL DATA:  62 year old female with abdominal pain since Thursday, progressively worsening. No associated nausea, vomiting or diarrhea. EXAM: CT ABDOMEN AND PELVIS WITH CONTRAST TECHNIQUE: Multidetector CT imaging of the abdomen and pelvis was performed using the standard protocol following bolus administration of intravenous contrast. CONTRAST:  153mL OMNIPAQUE IOHEXOL 300 MG/ML  SOLN COMPARISON:  None. FINDINGS: Lower chest: Unremarkable. Hepatobiliary: No definite cystic or solid hepatic lesions. No intra or extrahepatic biliary ductal dilatation. Gallbladder is normal in appearance. Pancreas: No pancreatic mass. No pancreatic ductal dilatation. No pancreatic or peripancreatic fluid or inflammatory changes. Spleen: Unremarkable. Adrenals/Urinary Tract: Bilateral kidneys and bilateral adrenal glands are normal in appearance. No hydroureteronephrosis. Urinary bladder is normal in appearance. Stomach/Bowel: Stomach is normal in appearance. No pathologic dilatation of small bowel or colon. Several scattered colonic diverticulae are noted. Importantly, in the region of the mid sigmoid colon there are surrounding inflammatory changes in the associated  mesocolon, as well as extraluminal gas best appreciated on axial image 51 of series 3, compatible with an acute diverticulitis. This extraluminal gas collection also has some associated fluid component measuring overall 3.2 x 1.4 x 2.4 cm (axial image 51 of series 3 and coronal image 74 of series 6), compatible with a small diverticular abscess. This has presumably perforated  given the small volume of pneumoperitoneum. Normal appendix. Vascular/Lymphatic: No significant atherosclerotic disease, aneurysm or dissection noted in the abdominal or pelvic vasculature. No lymphadenopathy noted in the abdomen or pelvis. Reproductive: Multiple uterine lesions are noted demonstrating heterogeneous attenuation, presumably multifocal fibroids. The largest of these measures up to 5.5 cm in diameter in the fundus. Ovaries are unremarkable in appearance. Other: Trace volume of ascites. Small amount of pneumoperitoneum scattered throughout the peritoneal cavity. Musculoskeletal: There are no aggressive appearing lytic or blastic lesions noted in the visualized portions of the skeleton. IMPRESSION: 1. Findings are compatible with an acute diverticulitis in the mid sigmoid colon with diverticular abscess and signs of frank perforation. Emergent surgical consultation is strongly recommended. 2. Fibroid uterus, as above. 3. Additional incidental findings, as above. Critical Value/emergent results were called by telephone at the time of interpretation on 01/30/2018 at 2:27 pm to [place]el[place], who verbally acknowledged these results. Electronically Signed   By: Vinnie Langton M.D.   On: 01/30/2018 14:37    Anti-infectives: Anti-infectives (From admission, onward)   Start     Dose/Rate Route Frequency Ordered Stop   01/31/18 0930  meropenem (MERREM) 1 g in sodium chloride 0.9 % 100 mL IVPB     1 g 200 mL/hr over 30 Minutes Intravenous Every 8 hours 01/31/18 0830     01/31/18 0300  ciprofloxacin (CIPRO) IVPB 400 mg  Status:  Discontinued     400 mg 200 mL/hr over 60 Minutes Intravenous Every 12 hours 01/30/18 1448 01/30/18 1519   01/30/18 2300  metroNIDAZOLE (FLAGYL) IVPB 500 mg  Status:  Discontinued     500 mg 100 mL/hr over 60 Minutes Intravenous Every 8 hours 01/30/18 1448 01/31/18 0816   01/30/18 1615  ciprofloxacin (CIPRO) IVPB 400 mg  Status:  Discontinued     400 mg 200 mL/hr  over 60 Minutes Intravenous Every 12 hours 01/30/18 1613 01/31/18 0816   01/30/18 1600  meropenem (MERREM) 1 g in sodium chloride 0.9 % 100 mL IVPB  Status:  Discontinued     1 g 200 mL/hr over 30 Minutes Intravenous Every 8 hours 01/30/18 1519 01/30/18 1612   01/30/18 1445  ciprofloxacin (CIPRO) IVPB 400 mg  Status:  Discontinued     400 mg 200 mL/hr over 60 Minutes Intravenous  Once 01/30/18 1437 01/30/18 1517   01/30/18 1445  metroNIDAZOLE (FLAGYL) IVPB 500 mg     500 mg 100 mL/hr over 60 Minutes Intravenous  Once 01/30/18 1437 01/30/18 1557       Assessment/Plan Obesity History of GERD  Sigmoid diverticulitis with abscess - abscess is small and not likely amenable to drain - WBC trending down 13.5, TMAX 100.7 - last colonoscopy 2012 Dr. Collene Mares  FEN: clears VTE: SCD's, lovenox ID: Cipro & Flagyl 05/04-05/05; meropenem 05/05>>day#2 Foley: none Follow up: TBD   DISPO: Pain improving and WBC trending down. Continue clears and IV meropenem today. Labs in AM.   LOS: 2 days    Wellington Hampshire , [facility] 02/01/2018, 8:10 AM Pager: [phone] Consults: [phone] Mon-Fri 7:00 am-4:30 pm Sat-Sun 7:00 am-11:30 am

## 2018-02-02 LAB — BASIC METABOLIC PANEL
Anion gap: 8 (ref 5–15)
CHLORIDE: 107 mmol/L (ref 101–111)
CO2: 25 mmol/L (ref 22–32)
CREATININE: 0.78 mg/dL (ref 0.44–1.00)
Calcium: 8.6 mg/dL — ABNORMAL LOW (ref 8.9–10.3)
GFR calc Af Amer: >60 mL/min (ref >60)
GFR calc non Af Amer: >60 mL/min (ref >60)
GLUCOSE: 112 mg/dL — AB (ref 65–99)
Potassium: 3.7 mmol/L (ref 3.5–5.1)
SODIUM: 140 mmol/L (ref 135–145)

## 2018-02-02 LAB — CBC
HCT: 36.5 % (ref 36.0–46.0)
HEMOGLOBIN: 12 g/dL (ref 12.0–15.0)
MCH: 28.9 pg (ref 26.0–34.0)
MCHC: 32.9 g/dL (ref 30.0–36.0)
MCV: 88 fL (ref 78.0–100.0)
Platelets: 241 10*3/uL (ref 150–400)
RBC: 4.15 MIL/uL (ref 3.87–5.11)
RDW: 13.7 % (ref 11.5–15.5)
WBC: 8 10*3/uL (ref 4.0–10.5)

## 2018-02-02 MED ORDER — FAMOTIDINE 20 MG PO TABS
20.0000 mg | ORAL_TABLET | Freq: Two times a day (BID) | ORAL | Status: DC
  Administered 2018-02-02 – 2018-02-03 (×2): 20 mg via ORAL
  Filled 2018-02-02 (×2): qty 1

## 2018-02-02 MED ORDER — OXYCODONE HCL 5 MG PO TABS: 5.0000 mg | ORAL_TABLET | ORAL | Status: DC | PRN

## 2018-02-02 NOTE — Progress Notes (Signed)
[facility] Progress Note     Subjective: CC- diverticulitis Patient states that her pain is improved today. She feels less bloated. Mild epigastric pain. Tolerating clear liquids. Today is the first day that she has felt hungry. She has a 5 loose BMs since yesterday. WBC WNL, afebrile.  Objective: Vital signs in last 24 hours: Temp:  [98.8 F (37.1 C)-98.9 F (37.2 C)] 98.8 F (37.1 C) (05/07 0538) Pulse Rate:  [74-84] 74 (05/07 0538) Resp:  [18] 18 (05/07 0538) BP: (101-119)/(59-74) 110/74 (05/07 0538) SpO2:  [99 %-100 %] 99 % (05/07 0538) Last BM Date: 02/01/18  Intake/Output from previous day: 05/06 0701 - 05/07 0700 In: 2125 [P.O.:340; I.V.:1535; IV Piggyback:250] Out: 3300 [Urine:3300] Intake/Output this shift: No intake/output data recorded.  PE: Gen:  Alert, NAD, pleasant HEENT: EOM's intact, pupils equal and round Card:  RRR, no M/G/R heard Pulm:  CTAB, no W/R/R, effort normal Abd: Soft, nondistended, +BS, no HSM, no hernia, well healed periumbilical incision, mild epigastric TTP without rebound or guarding Ext:  Calves soft and nontender Psych: A&Ox3  Skin: no rashes noted, warm and dry  Lab Results:  Recent Labs    02/01/18 0354 02/02/18 0343  WBC 13.5* 8.0  HGB 12.1 12.0  HCT 37.1 36.5  PLT 208 241   BMET Recent Labs    02/01/18 0354 02/02/18 0343  NA 134* 140  K 3.5 3.7  CL 102 107  CO2 24 25  GLUCOSE 116* 112*  BUN 6 <5*  CREATININE 1.00 0.78  CALCIUM 8.2* 8.6*   PT/INR No results for input(s): LABPROT, INR in the last 72 hours. CMP     Component Value Date/Time   NA 140 02/02/2018 0343   K 3.7 02/02/2018 0343   CL 107 02/02/2018 0343   CO2 25 02/02/2018 0343   GLUCOSE 112 (H) 02/02/2018 0343   BUN <5 (L) 02/02/2018 0343   CREATININE 0.78 02/02/2018 0343   CREATININE 0.78 05/14/2017 0939   CALCIUM 8.6 (L) 02/02/2018 0343   PROT 7.3 01/30/2018 1031   ALBUMIN 4.1 01/30/2018 1031   AST 20 01/30/2018 1031   ALT 25  01/30/2018 1031   ALKPHOS 76 01/30/2018 1031   BILITOT 1.2 01/30/2018 1031   GFRNONAA >60 02/02/2018 0343   GFRAA >60 02/02/2018 0343   Lipase     Component Value Date/Time   LIPASE 31 01/30/2018 1031       Studies/Results: No results found.  Anti-infectives: Anti-infectives (From admission, onward)   Start     Dose/Rate Route Frequency Ordered Stop   01/31/18 0930  meropenem (MERREM) 1 g in sodium chloride 0.9 % 100 mL IVPB     1 g 200 mL/hr over 30 Minutes Intravenous Every 8 hours 01/31/18 0830     01/31/18 0300  ciprofloxacin (CIPRO) IVPB 400 mg  Status:  Discontinued     400 mg 200 mL/hr over 60 Minutes Intravenous Every 12 hours 01/30/18 1448 01/30/18 1519   01/30/18 2300  metroNIDAZOLE (FLAGYL) IVPB 500 mg  Status:  Discontinued     500 mg 100 mL/hr over 60 Minutes Intravenous Every 8 hours 01/30/18 1448 01/31/18 0816   01/30/18 1615  ciprofloxacin (CIPRO) IVPB 400 mg  Status:  Discontinued     400 mg 200 mL/hr over 60 Minutes Intravenous Every 12 hours 01/30/18 1613 01/31/18 0816   01/30/18 1600  meropenem (MERREM) 1 g in sodium chloride 0.9 % 100 mL IVPB  Status:  Discontinued     1 g  200 mL/hr over 30 Minutes Intravenous Every 8 hours 01/30/18 1519 01/30/18 1612   01/30/18 1445  ciprofloxacin (CIPRO) IVPB 400 mg  Status:  Discontinued     400 mg 200 mL/hr over 60 Minutes Intravenous  Once 01/30/18 1437 01/30/18 1517   01/30/18 1445  metroNIDAZOLE (FLAGYL) IVPB 500 mg     500 mg 100 mL/hr over 60 Minutes Intravenous  Once 01/30/18 1437 01/30/18 1557       Assessment/Plan Obesity History of GERD  Sigmoid diverticulitis with abscess - abscess is small and not likely amenable to drain - WBC normalized 8.0, afebrile - last colonoscopy 2012 Dr. Collene Mares  ZOX:[id number] VTE: SCD's, lovenox WU:[id number] & Flagyl 05/04-05/05;meropenem05/05>>day#3 Foley:none Follow up:TBD  DISPO:WBC normalized and pain improving. Advance to full liquids. Continue IV abx.    LOS: 3 days    Wellington Hampshire , [facility] 02/02/2018, 8:08 AM Pager: [phone] Consults: [phone] Mon-Fri 7:00 am-4:30 pm Sat-Sun 7:00 am-11:30 am

## 2018-02-03 LAB — BASIC METABOLIC PANEL
Anion gap: 9 (ref 5–15)
CALCIUM: 8.7 mg/dL — AB (ref 8.9–10.3)
CO2: 24 mmol/L (ref 22–32)
Chloride: 108 mmol/L (ref 101–111)
Creatinine, Ser: 0.72 mg/dL (ref 0.44–1.00)
GLUCOSE: 92 mg/dL (ref 65–99)
Potassium: 3.8 mmol/L (ref 3.5–5.1)
Sodium: 141 mmol/L (ref 135–145)

## 2018-02-03 LAB — CBC
HEMATOCRIT: 36.9 % (ref 36.0–46.0)
HEMOGLOBIN: 12.2 g/dL (ref 12.0–15.0)
MCH: 28.9 pg (ref 26.0–34.0)
MCHC: 33.1 g/dL (ref 30.0–36.0)
MCV: 87.4 fL (ref 78.0–100.0)
Platelets: 262 10*3/uL (ref 150–400)
RBC: 4.22 MIL/uL (ref 3.87–5.11)
RDW: 13.5 % (ref 11.5–15.5)
WBC: 7.4 10*3/uL (ref 4.0–10.5)

## 2018-02-03 MED ORDER — CIPROFLOXACIN HCL 500 MG PO TABS: 500.0000 mg | ORAL_TABLET | Freq: Two times a day (BID) | ORAL | 0 refills | Status: AC

## 2018-02-03 MED ORDER — SACCHAROMYCES BOULARDII 250 MG PO CAPS: 250.0000 mg | ORAL_CAPSULE | Freq: Two times a day (BID) | ORAL | Status: AC

## 2018-02-03 MED ORDER — METRONIDAZOLE 500 MG PO TABS: 500.0000 mg | ORAL_TABLET | Freq: Three times a day (TID) | ORAL | 0 refills | Status: AC

## 2018-02-03 NOTE — Progress Notes (Addendum)
[facility] Progress Note     Subjective: CC- diverticulitis Patient states that she continues to feels better. She feels a little bloating but denies any current abdominal pain. Denies n/v. Tolerating full liquids. She had a loose BM this morning. WBC WNL, VSS.  Objective: Vital signs in last 24 hours: Temp:  [98.1 F (36.7 C)-98.9 F (37.2 C)] 98.1 F (36.7 C) (05/08 0455) Pulse Rate:  [66-70] 66 (05/08 0455) Resp:  [14-18] 16 (05/08 0455) BP: (124-127)/(72-81) 124/72 (05/08 0455) SpO2:  [99 %-100 %] 99 % (05/08 0455) Last BM Date: 02/02/18  Intake/Output from previous day: 05/07 0701 - 05/08 0700 In: 1671.7 [I.V.:1321.7; IV Piggyback:350] Out: 1000 [Urine:1000] Intake/Output this shift: No intake/output data recorded.  PE: Gen: Alert, NAD, pleasant HEENT: EOM's intact, pupils equal and round Card: RRR, no M/G/R heard Pulm: CTAB, no W/R/R, effort normal Abd: Soft,nondistended, +BS, no HSM, no hernia,well healed periumbilical incision, NT abdomen FXT:KWIOXB soft and nontender Psych: A&Ox3  Skin: no rashes noted, warm and dry  Lab Results:  Recent Labs    02/02/18 0343 02/03/18 0418  WBC 8.0 7.4  HGB 12.0 12.2  HCT 36.5 36.9  PLT 241 262   BMET Recent Labs    02/02/18 0343 02/03/18 0418  NA 140 141  K 3.7 3.8  CL 107 108  CO2 25 24  GLUCOSE 112* 92  BUN <5* <5*  CREATININE 0.78 0.72  CALCIUM 8.6* 8.7*   PT/INR No results for input(s): LABPROT, INR in the last 72 hours. CMP     Component Value Date/Time   NA 141 02/03/2018 0418   K 3.8 02/03/2018 0418   CL 108 02/03/2018 0418   CO2 24 02/03/2018 0418   GLUCOSE 92 02/03/2018 0418   BUN <5 (L) 02/03/2018 0418   CREATININE 0.72 02/03/2018 0418   CREATININE 0.78 05/14/2017 0939   CALCIUM 8.7 (L) 02/03/2018 0418   PROT 7.3 01/30/2018 1031   ALBUMIN 4.1 01/30/2018 1031   AST 20 01/30/2018 1031   ALT 25 01/30/2018 1031   ALKPHOS 76 01/30/2018 1031   BILITOT 1.2 01/30/2018  1031   GFRNONAA >60 02/03/2018 0418   GFRAA >60 02/03/2018 0418   Lipase     Component Value Date/Time   LIPASE 31 01/30/2018 1031       Studies/Results: No results found.  Anti-infectives: Anti-infectives (From admission, onward)   Start     Dose/Rate Route Frequency Ordered Stop   01/31/18 0930  meropenem (MERREM) 1 g in sodium chloride 0.9 % 100 mL IVPB     1 g 200 mL/hr over 30 Minutes Intravenous Every 8 hours 01/31/18 0830     01/31/18 0300  ciprofloxacin (CIPRO) IVPB 400 mg  Status:  Discontinued     400 mg 200 mL/hr over 60 Minutes Intravenous Every 12 hours 01/30/18 1448 01/30/18 1519   01/30/18 2300  metroNIDAZOLE (FLAGYL) IVPB 500 mg  Status:  Discontinued     500 mg 100 mL/hr over 60 Minutes Intravenous Every 8 hours 01/30/18 1448 01/31/18 0816   01/30/18 1615  ciprofloxacin (CIPRO) IVPB 400 mg  Status:  Discontinued     400 mg 200 mL/hr over 60 Minutes Intravenous Every 12 hours 01/30/18 1613 01/31/18 0816   01/30/18 1600  meropenem (MERREM) 1 g in sodium chloride 0.9 % 100 mL IVPB  Status:  Discontinued     1 g 200 mL/hr over 30 Minutes Intravenous Every 8 hours 01/30/18 1519 01/30/18 1612   01/30/18 1445  ciprofloxacin (  CIPRO) IVPB 400 mg  Status:  Discontinued     400 mg 200 mL/hr over 60 Minutes Intravenous  Once 01/30/18 1437 01/30/18 1517   01/30/18 1445  metroNIDAZOLE (FLAGYL) IVPB 500 mg     500 mg 100 mL/hr over 60 Minutes Intravenous  Once 01/30/18 1437 01/30/18 1557       Assessment/Plan Obesity History of GERD  Sigmoid diverticulitis with abscess - abscess is small and not likely amenable to drain - WBC normalized 7.4, afebrile - last colonoscopy 2012 Dr. Collene Mares  FEN: soft diet VTE: SCD's, lovenox KA:JGOTL & Flagyl 05/04-05/05;meropenem05/05>>day#4 Foley:none Follow up:TBD  DISPO:Advance to soft diet. Will recheck this afternoon for possible discharge. Patient will go home on oral antibiotics and f/u with Dr. Collene Mares for  colonoscopy in 5-6 weeks. Will discuss with MD if patient needs repeat CT scan as outpatient. Will arrange outpatient follow up appointment with colorectal surgeon due to complicated diverticulitis.   LOS: 4 days    Wellington Hampshire , [facility] 02/03/2018, 7:42 AM Pager: [phone] Consults: [phone] Mon-Fri 7:00 am-4:30 pm Sat-Sun 7:00 am-11:30 am

## 2018-02-03 NOTE — Discharge Summary (Signed)
  [facility] Discharge Summary   Patient ID: Heather Ochoa MRN: [id number] DOB/AGE: 60-Mar-1957 60 y.o.  Admit date: 01/30/2018 Discharge date: 02/03/2018  Admitting Diagnosis: Sigmoid diverticulitis with abscess  Discharge Diagnosis Patient Active Problem List   Diagnosis Date Noted  . Diverticulitis of intestine with perforation and abscess 01/30/2018  . Ankle clicking [id number]    Consultants None  Imaging: No results found.  Procedures None  Hospital Course:  Heather Ochoa is a 62yo female who presented to [facility] 5/4 with 2 days of periumbilical pain.  Workup included a CT scan which showed sigmoid diverticulitis with abscess. Abscess was small and not amenable to percutaneous drainage. Patient was admitted for bowel rest and IV antibiotics. Initially started on cipro/flagyl but was switched to meropenem on HD#2. WBC was monitored and normalized. As pain improved and WBC trended down diet was advanced as tolerated. On 5/8 the patient was voiding well, tolerating diet, ambulating well, pain well controlled, vital signs stable and felt stable for discharge home. She will go home with 8 additional days of cipro/flagyl. Patient will follow up as below and knows to call with questions or concerns.     Allergies as of 02/03/2018      Reactions   Penicillins Other (See Comments)   Has patient had a PCN reaction causing immediate rash, facial/tongue/throat swelling, SOB or lightheadedness with hypotension: Unknown Has patient had a PCN reaction causing severe rash involving mucus membranes or skin necrosis: Unknown Has patient had a PCN reaction that required hospitalization: Unknown Has patient had a PCN reaction occurring within the last 10 years: No If all of the above answers are "NO", then may proceed with Cephalosporin use. Childhood reaction      Medication List    TAKE these medications   ciprofloxacin 500 MG tablet Commonly known as:  CIPRO Take 1 tablet  (500 mg total) by mouth 2 (two) times daily for 8 days.   metroNIDAZOLE 500 MG tablet Commonly known as:  FLAGYL Take 1 tablet (500 mg total) by mouth 3 (three) times daily for 8 days.   saccharomyces boulardii 250 MG capsule Commonly known as:  FLORASTOR Take 1 capsule (250 mg total) by mouth 2 (two) times daily. You can find a probiotic over the counter.   vitamin C 1000 MG tablet Take 1,000 mg by mouth daily.        Follow-up Information    Juanita Craver, MD. Call in 1 month(s).   Specialty:  Gastroenterology Why:  Call to arrange follow up for a colonoscopy 5-6 weeks after your hospitalization Contact information: [address], [place] [address] [phone]        [place], [place], NP-C Follow up.   Specialty:  Family Medicine Contact information: [place] [address] [phone]        Ileana Roup, MD. Go on 02/10/2018.   Specialty:  General Surgery Why:  Your appointment is 02/10/18 at  3:15 pm with Dr. Dema Severin to discuss elective surgery for diverticulitis. Please arrive 30 minutes prior to your appointment to check in and fill out paperwork. Bring photo ID and insurance information. Contact information: [place] [address] [phone]           Signed: Wellington Hampshire, [facility] 02/03/2018, 1:47 PM Pager: [phone] Consults: [phone] Mon-Fri 7:00 am-4:30 pm Sat-Sun 7:00 am-11:30 am

## 2018-02-03 NOTE — Progress Notes (Signed)
Pt tolerated the soft diet, denies abd pain. Discharge instructions given to pt. Discharged to home accompanied by spouse.

## 2018-02-03 NOTE — Discharge Instructions (Signed)
Diverticulitis Diverticulitis is when small pockets in your large intestine (colon) get infected or swollen. This causes stomach pain and watery poop (diarrhea). These pouches are called diverticula. They form in people who have a condition called diverticulosis. Follow these instructions at home: Medicines  Take over-the-counter and prescription medicines only as told by your doctor. These include: ? Antibiotics. ? Pain medicines. ? Fiber pills. ? Probiotics. ? Stool softeners.  Do not drive or use heavy machinery while taking prescription pain medicine.  If you were prescribed an antibiotic, take it as told. Do not stop taking it even if you feel better. General instructions  Follow a diet as told by your doctor.  When you feel better, your doctor may tell you to change your diet. You may need to eat a lot of fiber. Fiber makes it easier to poop (have bowel movements). Healthy foods with fiber include: ? Berries. ? Beans. ? Lentils. ? Green vegetables.  Exercise 3 or more times a week. Aim for 30 minutes each time. Exercise enough to sweat and make your heart beat faster.  Keep all follow-up visits as told. This is important. You may need to have an exam of the large intestine. This is called a colonoscopy. Contact a doctor if:  Your pain does not get better.  You have a hard time eating or drinking.  You are not pooping like normal. Get help right away if:  Your pain gets worse.  Your problems do not get better.  Your problems get worse very fast.  You have a fever.  You throw up (vomit) more than one time.  You have poop that is: ? Bloody. ? Black. ? Tarry. Summary  Diverticulitis is when small pockets in your large intestine (colon) get infected or swollen.  Take medicines only as told by your doctor.  Follow a diet as told by your doctor. This information is not intended to replace advice given to you by your health care provider. Make sure you discuss  any questions you have with your health care provider. Document Released: 03/03/2008 Document Revised: 10/02/2016 Document Reviewed: 10/02/2016 Elsevier Interactive Patient Education  2017 Elsevier Inc.   Low-Fiber Diet Fiber is found in fruits, vegetables, and whole grains. A low-fiber diet restricts fibrous foods that are not digested in the small intestine. A diet containing about 10-15 grams of fiber per day is considered low fiber. Low-fiber diets may be used to:  Promote healing and rest the bowel during intestinal flare-ups.  Prevent blockage of a partially obstructed or narrowed gastrointestinal tract.  Reduce fecal weight and volume.  Slow the movement of feces.  You may be on a low-fiber diet as a transitional diet following surgery, after an injury (trauma), or because of a short (acute) or lifelong (chronic) illness. Your health care provider will determine the length of time you need to stay on this diet. What do I need to know about a low-fiber diet? Always check the fiber content on the packaging's Nutrition Facts label, especially on foods from the grains list. Ask your dietitian if you have questions about specific foods that are related to your condition, especially if the food is not listed below. In general, a low-fiber food will have less than 2 g of fiber. What foods can I eat? Grains All breads and crackers made with white flour. Sweet rolls, doughnuts, waffles, pancakes, [place] toast, bagels. Pretzels, Melba toast, zwieback. Well-cooked cereals, such as cornmeal, farina, or cream cereals. Dry cereals that do not  contain whole grains, fruit, or nuts, such as refined corn, wheat, rice, and oat cereals. Potatoes prepared any way without skins, plain pastas and noodles, refined white rice. Use white flour for baking and making sauces. Use allowed list of grains for casseroles, dumplings, and puddings. Vegetables Strained tomato and vegetable juices. Fresh lettuce,  cucumber, spinach. Well-cooked (no skin or pulp) or canned vegetables, such as asparagus, bean sprouts, beets, carrots, green beans, mushrooms, potatoes, pumpkin, spinach, yellow squash, tomato sauce/puree, turnips, yams, and zucchini. Keep servings limited to  cup. Fruits All fruit juices except prune juice. Cooked or canned fruits without skin and seeds, such as applesauce, apricots, cherries, fruit cocktail, grapefruit, grapes, mandarin oranges, melons, peaches, pears, pineapple, and plums. Fresh fruits without skin, such as apricots, avocados, bananas, melons, pineapple, nectarines, and peaches. Keep servings limited to  cup or 1 piece. Meat and Other Protein Sources Ground or well-cooked tender beef, ham, veal, lamb, pork, or poultry. Eggs, plain cheese. Fish, oysters, shrimp, lobster, and other seafood. Liver, organ meats. Smooth nut butters. Dairy All milk products and alternative dairy substitutes, such as soy, rice, almond, and coconut, not containing added whole nuts, seeds, or added fruit. Beverages Decaf coffee, fruit, and vegetable juices or smoothies (small amounts, with no pulp or skins, and with fruits from allowed list), sports drinks, herbal tea. Condiments Ketchup, mustard, vinegar, cream sauce, cheese sauce, cocoa powder. Spices in moderation, such as allspice, basil, bay leaves, celery powder or leaves, cinnamon, cumin powder, curry powder, ginger, mace, marjoram, onion or garlic powder, oregano, paprika, parsley flakes, ground pepper, rosemary, sage, savory, tarragon, thyme, and turmeric. Sweets and Desserts Plain cakes and cookies, pie made with allowed fruit, pudding, custard, cream pie. Gelatin, fruit, ice, sherbet, frozen ice pops. Ice cream, ice milk without nuts. Plain hard candy, honey, jelly, molasses, syrup, sugar, chocolate syrup, gumdrops, marshmallows. Limit overall sugar intake. Fats and Oil Margarine, butter, cream, mayonnaise, salad oils, plain salad dressings  made from allowed foods. Choose healthy fats such as olive oil, canola oil, and omega-3 fatty acids (such as found in salmon or tuna) when possible. Other Bouillon, broth, or cream soups made from allowed foods. Any strained soup. Casseroles or mixed dishes made with allowed foods. The items listed above may not be a complete list of recommended foods or beverages. Contact your dietitian for more options. What foods are not recommended? Grains All whole wheat and whole grain breads and crackers. Multigrains, rye, bran seeds, nuts, or coconut. Cereals containing whole grains, multigrains, bran, coconut, nuts, raisins. Cooked or dry oatmeal, steel-cut oats. Coarse wheat cereals, granola. Cereals advertised as high fiber. Potato skins. Whole grain pasta, wild or brown rice. Popcorn. Coconut flour. Bran, buckwheat, corn bread, multigrains, rye, wheat germ. Vegetables Fresh, cooked or canned vegetables, such as artichokes, asparagus, beet greens, broccoli, Brussels sprouts, cabbage, celery, cauliflower, corn, eggplant, kale, legumes or beans, okra, peas, and tomatoes. Avoid large servings of any vegetables, especially raw vegetables. Fruits Fresh fruits, such as apples with or without skin, berries, cherries, figs, grapes, grapefruit, guavas, kiwis, mangoes, oranges, papayas, pears, persimmons, pineapple, and pomegranate. Prune juice and juices with pulp, stewed or dried prunes. Dried fruits, dates, raisins. Fruit seeds or skins. Avoid large servings of all fresh fruits. Meats and Other Protein Sources Tough, fibrous meats with gristle. Chunky nut butter. Cheese made with seeds, nuts, or other foods not recommended. Nuts, seeds, legumes (beans, including baked beans), dried peas, beans, lentils. Dairy Yogurt or cheese that contains nuts, seeds, or added  fruit. Beverages Fruit juices with high pulp, prune juice. Caffeinated coffee and teas. Condiments Coconut, maple syrup, pickles, olives. Sweets and  Desserts Desserts, cookies, or candies that contain nuts or coconut, chunky peanut butter, dried fruits. Jams, preserves with seeds, marmalade. Large amounts of sugar and sweets. Any other dessert made with fruits from the not recommended list. Other Soups made from vegetables that are not recommended or that contain other foods not recommended. The items listed above may not be a complete list of foods and beverages to avoid. Contact your dietitian for more information. This information is not intended to replace advice given to you by your health care provider. Make sure you discuss any questions you have with your health care provider. Document Released: 03/07/2002 Document Revised: 02/21/2016 Document Reviewed: 08/08/2013 Elsevier Interactive Patient Education  2017 [employer].

## 2018-02-12 ENCOUNTER — Ambulatory Visit (INDEPENDENT_AMBULATORY_CARE_PROVIDER_SITE_OTHER): Payer: BLUE CROSS/BLUE SHIELD | Admitting: Family Medicine

## 2018-02-12 ENCOUNTER — Encounter: Payer: Self-pay | Admitting: Family Medicine

## 2018-02-12 VITALS — BP 110/70 | HR 81 | Temp 98.7°F | Ht 67.5 in | Wt 191.6 lb

## 2018-02-12 DIAGNOSIS — K5792 Diverticulitis of intestine, part unspecified, without perforation or abscess without bleeding: Secondary | ICD-10-CM

## 2018-02-12 DIAGNOSIS — Z09 Encounter for follow-up examination after completed treatment for conditions other than malignant neoplasm: Secondary | ICD-10-CM | POA: Diagnosis not present

## 2018-02-12 NOTE — Progress Notes (Signed)
   Subjective:    Patient ID: Heather Ochoa, female    DOB: 29-Nov-1955, 62 y.o.   MRN: [id number]  HPI Chief Complaint  Patient presents with  . Hospitalization Follow-up   She was hospitalized from Jan 30 2018 to Feb 03 2018 for diverticulitis with abscess and perforation. She is completing her antibiotics today. Reports doing much better, feels at least 85% back to baseline. No new concerns or complaints.   Last colonoscopy 7 years ago with Dr. Collene Mares.  Has an appointment for Feb 23 2018.   Past Medical History:  Diagnosis Date  . GERD (gastroesophageal reflux disease)    Dr. Collene Mares is GI  . Hypercholesterolemia   . Uterine leiomyoma    Past Surgical History:  Procedure Laterality Date  . TONSILLECTOMY    . UMBILICAL HERNIA REPAIR        Review of Systems Pertinent positives and negatives in the history of present illness.     Objective:   Physical Exam BP 110/70   Pulse 81   Temp 98.7 F (37.1 C) (Oral)   Ht 5' 7.5" (1.715 m)   Wt 191 lb 9.6 oz (86.9 kg)   LMP 04/02/2016   SpO2 97%   BMI 29.57 kg/m   Alert and in no distress. Pharyngeal area is normal. Neck is supple without adenopathy or thyromegaly. Cardiac exam shows a regular sinus rhythm without murmurs or gallops. Lungs are clear to auscultation.  Abdomen is soft, nondistended, nontender, normal bowel sounds, no guarding or rebound.  Skin is warm and dry.      Assessment & Plan:  Hospital discharge follow-up  Diverticulitis  Reviewed hospital notes specifically discharge summary and all lab results and CT results.  Blood work is not needed today.  Exam is unremarkable. She appears to be doing quite well.  Reports feeling at least 85% improved.  She will finish the antibiotics today and see Dr. Collene Mares for follow-up.

## 2018-02-23 DIAGNOSIS — K5730 Diverticulosis of large intestine without perforation or abscess without bleeding: Secondary | ICD-10-CM | POA: Diagnosis not present

## 2018-02-23 DIAGNOSIS — R140 Abdominal distension (gaseous): Secondary | ICD-10-CM | POA: Diagnosis not present

## 2018-02-23 DIAGNOSIS — R933 Abnormal findings on diagnostic imaging of other parts of digestive tract: Secondary | ICD-10-CM | POA: Diagnosis not present

## 2018-02-25 DIAGNOSIS — R140 Abdominal distension (gaseous): Secondary | ICD-10-CM | POA: Diagnosis not present

## 2018-02-25 DIAGNOSIS — K5730 Diverticulosis of large intestine without perforation or abscess without bleeding: Secondary | ICD-10-CM | POA: Diagnosis not present

## 2018-02-25 DIAGNOSIS — R1032 Left lower quadrant pain: Secondary | ICD-10-CM | POA: Diagnosis not present

## 2018-02-25 DIAGNOSIS — R933 Abnormal findings on diagnostic imaging of other parts of digestive tract: Secondary | ICD-10-CM | POA: Diagnosis not present

## 2018-03-16 DIAGNOSIS — K5792 Diverticulitis of intestine, part unspecified, without perforation or abscess without bleeding: Secondary | ICD-10-CM | POA: Diagnosis not present

## 2018-05-21 ENCOUNTER — Telehealth: Payer: Self-pay | Admitting: Gastroenterology

## 2018-05-21 NOTE — Telephone Encounter (Signed)
Records will be placed on Dr.Jacobs' desk for review.

## 2018-05-24 ENCOUNTER — Encounter: Payer: Self-pay | Admitting: Gastroenterology

## 2018-05-24 NOTE — Telephone Encounter (Signed)
Dr. Ardis Hughs reviewed records and has accepted patient. Ok to schedule Office Visit. Appointment scheduled.

## 2018-05-25 NOTE — Progress Notes (Signed)
Subjective:    Patient ID: Heather Ochoa, female    DOB: 1956-06-29, 62 y.o.   MRN: [id number]  HPI Chief Complaint  Patient presents with  . fasting cpe    fasting cpe . pap done 2018. gets eye checked yearly. declines flu shot. no other concers   She is here for a complete physical exam. Previous medical care: Last CPE: 04/2017  Other providers: OB/GYN- [place]. Dr. Clydene Laming- [facility]. Dr. Carlye Grippe- Dentist. [place] [facility]. Chiropractor-Dr. Carma Leaven every 3 weeks. GI- Dr. Ardis Hughs   History of low calcium. Needs follow up  Social history: Lives with husband, no kids, works in [place] for [employer].  Diet: fairly unhealthy  Excerise: yoga, treadmill   Immunizations: flu shot- declines. Tdap and Shingrix UTD  Health maintenance:  Mammogram: 6 month follow up due in November  Colonoscopy: scheduled in October with Dr. Ardis Hughs  Last Gynecological Exam: 2018 Last Menstrual cycle: 03/2016 Last Dental Exam: every 6 months  Last Eye Exam: 09/2017  Wears seatbelt always, uses sunscreen, smoke detectors in home and functioning, does not text while driving and feels safe in home environment.   Reviewed allergies, medications, past medical, surgical, family, and social history.   Review of Systems Review of Systems Constitutional: -fever, -chills, -sweats, -unexpected weight change,-fatigue ENT: -runny nose, -ear pain, -sore throat Cardiology:  -chest pain, -palpitations, -edema Respiratory: -cough, -shortness of breath, -wheezing Gastroenterology: -abdominal pain, -nausea, -vomiting, -diarrhea, -constipation  Hematology: -bleeding or bruising problems Musculoskeletal: -arthralgias, -myalgias, -joint swelling, -back pain Ophthalmology: -vision changes Urology: -dysuria, -difficulty urinating, -hematuria, -urinary frequency, -urgency Neurology: -headache, -weakness, -tingling, -numbness       Objective:   Physical Exam BP 120/70   Pulse 60   Ht 5' 7.5" (1.715 m)    Wt 179 lb 12.8 oz (81.6 kg)   LMP 04/02/2016   BMI 27.75 kg/m   General Appearance:    Alert, cooperative, no distress, appears stated age  Head:    Normocephalic, without obvious abnormality, atraumatic  Eyes:    PERRL, conjunctiva/corneas clear, EOM's intact, fundi    benign  Ears:    Normal TM's and external ear canals  Nose:   Nares normal, mucosa normal, no drainage or sinus   tenderness  Throat:   Lips, mucosa, and tongue normal; teeth and gums normal  Neck:   Supple, no lymphadenopathy;  thyroid:  no   enlargement/tenderness/nodules; no carotid   bruit or JVD  Back:    Spine nontender, no curvature, ROM normal, no CVA     tenderness  Lungs:     Clear to auscultation bilaterally without wheezes, rales or     ronchi; respirations unlabored  Chest Wall:    No tenderness or deformity   Heart:    Regular rate and rhythm, S1 and S2 normal, no murmur, rub   or gallop  Breast Exam:    Declines. 6 month follow up with mamm and US  Abdomen:     Soft, non-tender, nondistended, normoactive bowel sounds,    no masses, no hepatosplenomegaly  Genitalia:    Declines. Pap smear UTD      Extremities:   No clubbing, cyanosis or edema  Pulses:   2+ and symmetric all extremities  Skin:   Skin color, texture, turgor normal, no rashes or lesions  Lymph nodes:   Cervical, supraclavicular, and axillary nodes normal  Neurologic:   CNII-XII intact, normal strength, sensation and gait; reflexes 2+ and symmetric throughout  Psych:   Normal mood, affect, hygiene and grooming.     Urinalysis dipstick: Large amount of leukocytes, otherwise negative.   (asymptomatic)     Assessment & Plan:  Routine general medical examination at a health care facility - Plan: POCT Urinalysis DIP (Proadvantage Device), CBC with Differential/Platelet, Comprehensive metabolic panel, TSH, T4, free, Lipid panel  Estrogen deficiency - Plan: VITAMIN D 25 Hydroxy (Vit-D Deficiency, Fractures)  Hypocalcemia - Plan:  Comprehensive metabolic panel, TSH, T4, free, VITAMIN D 25 Hydroxy (Vit-D Deficiency, Fractures), PTH, Intact and Calcium  Leukocytes in urine - Plan: Urine Culture  Elevated LDL cholesterol level - Plan: Lipid panel  She is doing well and in good spirits even though her husband is being treated for lymphoma.  Hypocalcemia- check PTH, vitamin D Check urine culture due to large leuks in urine. She is asymptomatic and prefer to avoid antibiotics unless urine culture shows infection.  Will consider DEXA when she gets her 6 month mammogram f/u Immunizations UTD but declines flu shot.  Check labs and follow up.

## 2018-05-26 ENCOUNTER — Ambulatory Visit (INDEPENDENT_AMBULATORY_CARE_PROVIDER_SITE_OTHER): Payer: BLUE CROSS/BLUE SHIELD | Admitting: Family Medicine

## 2018-05-26 ENCOUNTER — Encounter: Payer: Self-pay | Admitting: Family Medicine

## 2018-05-26 VITALS — BP 120/70 | HR 60 | Ht 67.5 in | Wt 179.8 lb

## 2018-05-26 DIAGNOSIS — R82998 Other abnormal findings in urine: Secondary | ICD-10-CM | POA: Diagnosis not present

## 2018-05-26 DIAGNOSIS — E2839 Other primary ovarian failure: Secondary | ICD-10-CM | POA: Diagnosis not present

## 2018-05-26 DIAGNOSIS — E7800 Pure hypercholesterolemia, unspecified: Secondary | ICD-10-CM

## 2018-05-26 DIAGNOSIS — Z0000 Encounter for general adult medical examination without abnormal findings: Secondary | ICD-10-CM

## 2018-05-26 LAB — POCT URINALYSIS DIP (PROADVANTAGE DEVICE)
Bilirubin, UA: NEGATIVE
Blood, UA: NEGATIVE
Glucose, UA: NEGATIVE mg/dL
Ketones, POC UA: NEGATIVE mg/dL
NITRITE UA: NEGATIVE
PH UA: 6.5 (ref 5.0–8.0)
PROTEIN UA: NEGATIVE mg/dL
Specific Gravity, Urine: 1.015
UUROB: NEGATIVE

## 2018-05-26 NOTE — Patient Instructions (Signed)

## 2018-05-27 LAB — CBC WITH DIFFERENTIAL/PLATELET
BASOS ABS: 0 10*3/uL (ref 0.0–0.2)
Basos: 1 %
EOS (ABSOLUTE): 0 10*3/uL (ref 0.0–0.4)
Eos: 1 %
HEMOGLOBIN: 14.7 g/dL (ref 11.1–15.9)
Hematocrit: 43 % (ref 34.0–46.6)
IMMATURE GRANS (ABS): 0 10*3/uL (ref 0.0–0.1)
Immature Granulocytes: 0 %
LYMPHS: 48 %
Lymphocytes Absolute: 2.7 10*3/uL (ref 0.7–3.1)
MCH: 29.7 pg (ref 26.6–33.0)
MCHC: 34.2 g/dL (ref 31.5–35.7)
MCV: 87 fL (ref 79–97)
MONOCYTES: 5 %
Monocytes Absolute: 0.3 10*3/uL (ref 0.1–0.9)
NEUTROS PCT: 45 %
Neutrophils Absolute: 2.5 10*3/uL (ref 1.4–7.0)
PLATELETS: 250 10*3/uL (ref 150–450)
RBC: 4.95 x10E6/uL (ref 3.77–5.28)
RDW: 14.6 % (ref 12.3–15.4)
WBC: 5.5 10*3/uL (ref 3.4–10.8)

## 2018-05-27 LAB — LIPID PANEL
CHOLESTEROL TOTAL: 223 mg/dL — AB (ref 100–199)
Chol/HDL Ratio: 3.5 ratio (ref 0.0–4.4)
HDL: 64 mg/dL (ref >39)
LDL Calculated: 135 mg/dL — ABNORMAL HIGH (ref 0–99)
TRIGLYCERIDES: 122 mg/dL (ref 0–149)
VLDL Cholesterol Cal: 24 mg/dL (ref 5–40)

## 2018-05-27 LAB — COMPREHENSIVE METABOLIC PANEL
ALBUMIN: 4.4 g/dL (ref 3.5–5.5)
ALT: 31 IU/L (ref 0–32)
AST: 20 IU/L (ref 0–40)
Albumin/Globulin Ratio: 1.8 (ref 1.2–2.2)
Alkaline Phosphatase: 90 IU/L (ref 39–117)
BILIRUBIN TOTAL: 0.4 mg/dL (ref 0.0–1.2)
BUN / CREAT RATIO: 15 (ref 9–23)
BUN: 12 mg/dL (ref 6–24)
CHLORIDE: 103 mmol/L (ref 96–106)
CO2: 23 mmol/L (ref 20–29)
CREATININE: 0.82 mg/dL (ref 0.57–1.00)
Calcium: 9.5 mg/dL (ref 8.7–10.2)
GFR calc Af Amer: 91 mL/min/{1.73_m2} (ref >59)
GFR calc non Af Amer: 79 mL/min/{1.73_m2} (ref >59)
GLUCOSE: 87 mg/dL (ref 65–99)
Globulin, Total: 2.4 g/dL (ref 1.5–4.5)
Potassium: 4.6 mmol/L (ref 3.5–5.2)
Sodium: 141 mmol/L (ref 134–144)
Total Protein: 6.8 g/dL (ref 6.0–8.5)

## 2018-05-27 LAB — PTH, INTACT AND CALCIUM: PTH: 32 pg/mL (ref 15–65)

## 2018-05-27 LAB — T4, FREE: Free T4: 1.36 ng/dL (ref 0.82–1.77)

## 2018-05-27 LAB — VITAMIN D 25 HYDROXY (VIT D DEFICIENCY, FRACTURES): Vit D, 25-Hydroxy: 30.2 ng/mL (ref 30.0–100.0)

## 2018-05-27 LAB — TSH: TSH: 1.98 u[IU]/mL (ref 0.450–4.500)

## 2018-05-28 LAB — URINE CULTURE

## 2018-06-16 DIAGNOSIS — L918 Other hypertrophic disorders of the skin: Secondary | ICD-10-CM | POA: Diagnosis not present

## 2018-07-07 ENCOUNTER — Ambulatory Visit (INDEPENDENT_AMBULATORY_CARE_PROVIDER_SITE_OTHER): Payer: BLUE CROSS/BLUE SHIELD | Admitting: Gastroenterology

## 2018-07-07 ENCOUNTER — Encounter: Payer: Self-pay | Admitting: Gastroenterology

## 2018-07-07 ENCOUNTER — Encounter

## 2018-07-07 VITALS — BP 116/78 | HR 72 | Ht 68.0 in | Wt 182.5 lb

## 2018-07-07 DIAGNOSIS — K5732 Diverticulitis of large intestine without perforation or abscess without bleeding: Secondary | ICD-10-CM

## 2018-07-07 MED ORDER — PEG 3350-KCL-NA BICARB-NACL 420 G PO SOLR: 4000.0000 mL | ORAL | 0 refills | Status: DC

## 2018-07-07 NOTE — Progress Notes (Signed)
HPI: This is a very pleasant 62 year old woman who was referred to me by Harland Dingwall L, NP-C  to evaluate recent acute diverticulitis.    Chief complaint is recent acute diverticulitis  This past May 2019 she had severe abdominal pain and eventually presented to the hospital.  White blood cell count was 15,000, CT scan showed findings consistent with sigmoid diverticulitis with a small abscess adjacent.  She was admitted to the hospital for IV antibiotics and surgical consultation.  She never required a drain or surgery.  After about 4 5 days she was sent home in good condition, her abdominal pain was improving.  She completed a total of about 2 weeks of antibiotics and eventually recovered, her bowels back to normal and her abdominal pain completely resolved.  She went to see her previous gastroenterologist who suggested she have a colonoscopy and told her that she would definitely need a segmental colectomy  She saw her general surgeon who also recommended a colonoscopy but was not so definitive about need for segmental colectomy.    Old Data Reviewed:  WBC 15.8K 01/2018  CT scan abdomen and pelvis with IV and oral contrast May 2019 1. Findings are compatible with an acute diverticulitis in the mid sigmoid colon with diverticular abscess and signs of frank perforation. Emergent surgical consultation is strongly recommended. 2. Fibroid uterus, as above. 3. Additional incidental findings, as above.   Colonoscopy Dr. Juanita Craver:  November 2012 for colon cancer screening, her mother had colon polyps.  Hemorrhoids were found and a single left-sided diverticulum was noted.  No polyps or cancers.  Review of systems: Pertinent positive and negative review of systems were noted in the above HPI section. All other review negative.   Past Medical History:  Diagnosis Date  . Bowel obstruction ([place])   . Diverticulitis   . GERD (gastroesophageal reflux disease)    Dr. Collene Mares is GI  .  Hypercholesterolemia   . Umbilical hernia   . Uterine leiomyoma     Past Surgical History:  Procedure Laterality Date  . TONSILLECTOMY    . UMBILICAL HERNIA REPAIR      Current Outpatient Medications  Medication Sig Dispense Refill  . Ascorbic Acid (VITAMIN C) 1000 MG tablet Take 1,000 mg by mouth daily.    Marland Kitchen saccharomyces boulardii (FLORASTOR) 250 MG capsule Take 1 capsule (250 mg total) by mouth 2 (two) times daily. You can find a probiotic over the counter.     No current facility-administered medications for this visit.     Allergies as of 07/07/2018 - Review Complete 07/07/2018  Allergen Reaction Noted  . Penicillins Other (See Comments)     Family History  Problem Relation Age of Onset  . Congestive Heart Failure Mother   . Dementia Mother   . Pneumonia Father   . Breast cancer Paternal Grandmother     Social History   Socioeconomic History  . Marital status: Married    Spouse name: Not on file  . Number of children: 0  . Years of education: Not on file  . Highest education level: Not on file  Occupational History  . Occupation: HR  Social Needs  . Financial resource strain: Not on file  . Food insecurity:    Worry: Not on file    Inability: Not on file  . Transportation needs:    Medical: Not on file    Non-medical: Not on file  Tobacco Use  . Smoking status: Never Smoker  . Smokeless tobacco:  Never Used  Substance and Sexual Activity  . Alcohol use: Yes    Alcohol/week: 0.0 standard drinks    Comment: 1-2 glasses of wine a week  . Drug use: No  . Sexual activity: Not on file  Lifestyle  . Physical activity:    Days per week: Not on file    Minutes per session: Not on file  . Stress: Not on file  Relationships  . Social connections:    Talks on phone: Not on file    Gets together: Not on file    Attends religious service: Not on file    Active member of club or organization: Not on file    Attends meetings of clubs or organizations: Not on  file    Relationship status: Not on file  . Intimate partner violence:    Fear of current or ex partner: Not on file    Emotionally abused: Not on file    Physically abused: Not on file    Forced sexual activity: Not on file  Other Topics Concern  . Not on file  Social History Narrative  . Not on file     Physical Exam: BP 116/78   Pulse 72   Ht 5\' 8"  (1.727 m)   Wt 182 lb 8 oz (82.8 kg)   LMP 04/02/2016   BMI 27.75 kg/m  Constitutional: generally well-appearing Psychiatric: alert and oriented x3 Eyes: extraocular movements intact Mouth: oral pharynx moist, no lesions Neck: supple no lymphadenopathy Cardiovascular: heart regular rate and rhythm Lungs: clear to auscultation bilaterally Abdomen: soft, nontender, nondistended, no obvious ascites, no peritoneal signs, normal bowel sounds Extremities: no lower extremity edema bilaterally Skin: no lesions on visible extremities   Assessment and plan: 62 y.o. female with recent sigmoid diverticulitis  She did have a small diverticular abscess but responded to IV and then oral antibiotics.  This was the first time she ever had symptoms from diverticular disease.  I agree that she needs a colonoscopy to exclude other potential causes such as neoplasm which although uncommon it certainly needs to be ruled out.  I explained to her that I generally recommend segmental colectomy only if there is well-documented recurrent disease rather than a single episode such as hers.  I see no reason for any further blood tests or imaging studies prior to the colonoscopy.    Please see the "Patient Instructions" section for addition details about the plan.   Owens Loffler, MD [facility] 07/07/2018, 10:44 AM  Cc: Henson, Vickie L, NP-C

## 2018-07-07 NOTE — Patient Instructions (Addendum)
You will be set up for a colonoscopy for recent diverticulitis.  You have been scheduled for a colonoscopy. Please follow written instructions given to you at your visit today.  Please pick up your prep supplies at the pharmacy within the next 1-3 days. If you use inhalers (even only as needed), please bring them with you on the day of your procedure. Your physician has requested that you go to www.startemmi.com and enter the access code given to you at your visit today. This web site gives a general overview about your procedure. However, you should still follow specific instructions given to you by our office regarding your preparation for the procedure.  Thank you for entrusting me with your care and choosing [facility].  Dr Ardis Hughs

## 2018-08-11 ENCOUNTER — Encounter: Payer: Self-pay | Admitting: Gastroenterology

## 2018-08-11 ENCOUNTER — Ambulatory Visit (AMBULATORY_SURGERY_CENTER): Payer: BLUE CROSS/BLUE SHIELD | Admitting: Gastroenterology

## 2018-08-11 VITALS — BP 111/67 | HR 52 | Temp 97.1°F | Resp 15 | Ht 68.0 in | Wt 182.0 lb

## 2018-08-11 DIAGNOSIS — D122 Benign neoplasm of ascending colon: Secondary | ICD-10-CM

## 2018-08-11 DIAGNOSIS — D120 Benign neoplasm of cecum: Secondary | ICD-10-CM

## 2018-08-11 DIAGNOSIS — K5732 Diverticulitis of large intestine without perforation or abscess without bleeding: Secondary | ICD-10-CM | POA: Diagnosis not present

## 2018-08-11 MED ORDER — SODIUM CHLORIDE 0.9 % IV SOLN: 500.0000 mL | Freq: Once | INTRAVENOUS | Status: DC

## 2018-08-11 NOTE — Progress Notes (Signed)
Called to room to assist during endoscopic procedure.  Patient ID and intended procedure confirmed with present staff. Received instructions for my participation in the procedure from the performing physician.  

## 2018-08-11 NOTE — Progress Notes (Signed)
To PACU, VSS. Report to [email or web address] 

## 2018-08-11 NOTE — Op Note (Signed)
[facility] Patient Name: Heather Ochoa Procedure Date: 08/11/2018 2:58 PM MRN: [id number] Endoscopist: Milus Banister , MD Age: 62 Referring MD:  Date of Birth: Jun 29, 1956 Gender: Female Account #: [id number] Procedure:                Colonoscopy Indications:              Abnormal CT of the GI tract; May 2019 sigmoid                            diverticulitis (1st episode) Medicines:                Monitored Anesthesia Care Procedure:                Pre-Anesthesia Assessment:                           - Prior to the procedure, a History and Physical                            was performed, and patient medications and                            allergies were reviewed. The patient's tolerance of                            previous anesthesia was also reviewed. The risks                            and benefits of the procedure and the sedation                            options and risks were discussed with the patient.                            All questions were answered, and informed consent                            was obtained. Prior Anticoagulants: The patient has                            taken no previous anticoagulant or antiplatelet                            agents. ASA Grade Assessment: II - A patient with                            mild systemic disease. After reviewing the risks                            and benefits, the patient was deemed in                            satisfactory condition to undergo the procedure.  After obtaining informed consent, the colonoscope                            was passed under direct vision. Throughout the                            procedure, the patient's blood pressure, pulse, and                            oxygen saturations were monitored continuously. The                            Model CF-HQ190L [id number]) scope was introduced                            through the anus and advanced to  the the cecum,                            identified by appendiceal orifice and ileocecal                            valve. The colonoscopy was performed without                            difficulty. The patient tolerated the procedure                            well. The quality of the bowel preparation was                            good. The ileocecal valve, appendiceal orifice, and                            rectum were photographed. Scope In: 3:09:42 PM Scope Out: 3:26:38 PM Scope Withdrawal Time: 0 hours 13 minutes 6 seconds  Total Procedure Duration: 0 hours 16 minutes 56 seconds  Findings:                 Two sessile polyps were found in the transverse                            colon and ascending colon. The polyps were 3 to 9                            mm in size. These polyps were removed with a cold                            snare. Resection and retrieval were complete.                           Multiple small and large-mouthed diverticula were                            found in the left colon; there  was associated                            mucosal edema and luminal narrowing in the sigmoid                            region.                           The exam was otherwise without abnormality on                            direct and retroflexion views. Complications:            No immediate complications. Estimated blood loss:                            None. Estimated Blood Loss:     Estimated blood loss: none. Impression:               - Two 3 to 9 mm polyps in the transverse colon and                            in the ascending colon, removed with a cold snare.                            Resected and retrieved.                           - Diverticulosis in the left colon.                           - The examination was otherwise normal on direct                            and retroflexion views. Recommendation:           - Patient has a contact number available for                             emergencies. The signs and symptoms of potential                            delayed complications were discussed with the                            patient. Return to normal activities tomorrow.                            Written discharge instructions were provided to the                            patient.                           - Resume previous diet.                           -  Continue present medications.                           You will receive a letter within 2-3 weeks with the                            pathology results and my final recommendations.                           If the polyp(s) is proven to be 'pre-cancerous' on                            pathology, you will need repeat colonoscopy in 5                            years. If the polyp(s) is NOT 'precancerous' on                            pathology then you should repeat colon cancer                            screening in 10 years with colonoscopy without need                            for colon cancer screening by any method prior to                            then (including stool testing). Milus Banister, MD 08/11/2018 3:30:31 PM This report has been signed electronically.

## 2018-08-11 NOTE — Patient Instructions (Signed)
Handouts Provided:  Polyps and Diverticulitis  YOU HAD AN ENDOSCOPIC PROCEDURE TODAY AT [facility]:   Refer to the procedure report that was given to you for any specific questions about what was found during the examination.  If the procedure report does not answer your questions, please call your gastroenterologist to clarify.  If you requested that your care partner not be given the details of your procedure findings, then the procedure report has been included in a sealed envelope for you to review at your convenience later.  YOU SHOULD EXPECT: Some feelings of bloating in the abdomen. Passage of more gas than usual.  Walking can help get rid of the air that was put into your GI tract during the procedure and reduce the bloating. If you had a lower endoscopy (such as a colonoscopy or flexible sigmoidoscopy) you may notice spotting of blood in your stool or on the toilet paper. If you underwent a bowel prep for your procedure, you may not have a normal bowel movement for a few days.  Please Note:  You might notice some irritation and congestion in your nose or some drainage.  This is from the oxygen used during your procedure.  There is no need for concern and it should clear up in a day or so.  SYMPTOMS TO REPORT IMMEDIATELY:   Following lower endoscopy (colonoscopy or flexible sigmoidoscopy):  Excessive amounts of blood in the stool  Significant tenderness or worsening of abdominal pains  Swelling of the abdomen that is new, acute  Fever of 100F or higher   For urgent or emergent issues, a gastroenterologist can be reached at any hour by calling [phone].   DIET:  We do recommend a small meal at first, but then you may proceed to your regular diet.  Drink plenty of fluids but you should avoid alcoholic beverages for 24 hours.  ACTIVITY:  You should plan to take it easy for the rest of today and you should NOT DRIVE or use heavy machinery until tomorrow (because of  the sedation medicines used during the test).    FOLLOW UP: Our staff will call the number listed on your records the next business day following your procedure to check on you and address any questions or concerns that you may have regarding the information given to you following your procedure. If we do not reach you, we will leave a message.  However, if you are feeling well and you are not experiencing any problems, there is no need to return our call.  We will assume that you have returned to your regular daily activities without incident.  If any biopsies were taken you will be contacted by phone or by letter within the next 1-3 weeks.  Please call us at [phone] if you have not heard about the biopsies in 3 weeks.    SIGNATURES/CONFIDENTIALITY: You and/or your care partner have signed paperwork which will be entered into your electronic medical record.  These signatures attest to the fact that that the information above on your After Visit Summary has been reviewed and is understood.  Full responsibility of the confidentiality of this discharge information lies with you and/or your care-partner.

## 2018-08-12 ENCOUNTER — Telehealth: Payer: Self-pay

## 2018-08-12 NOTE — Telephone Encounter (Signed)
  Follow up Call-  Call back number 08/11/2018  Post procedure Call Back phone  # [phone]  Permission to leave phone message Yes  Some recent data might be hidden     Patient questions:  Do you have a fever, pain , or abdominal swelling? No. Pain Score  0 *  Have you tolerated food without any problems? Yes.    Have you been able to return to your normal activities? Yes.    Do you have any questions about your discharge instructions: Diet   No. Medications  No. Follow up visit  No.  Do you have questions or concerns about your Care? No.  Actions: * If pain score is 4 or above: No action needed, pain <4.

## 2018-08-17 ENCOUNTER — Encounter: Payer: Self-pay | Admitting: Gastroenterology

## 2018-10-27 DIAGNOSIS — Z803 Family history of malignant neoplasm of breast: Secondary | ICD-10-CM | POA: Diagnosis not present

## 2018-10-27 DIAGNOSIS — N6312 Unspecified lump in the right breast, upper inner quadrant: Secondary | ICD-10-CM | POA: Diagnosis not present

## 2018-10-27 LAB — HM MAMMOGRAPHY

## 2018-11-12 ENCOUNTER — Encounter: Payer: Self-pay | Admitting: Internal Medicine

## 2019-05-12 DIAGNOSIS — L814 Other melanin hyperpigmentation: Secondary | ICD-10-CM | POA: Diagnosis not present

## 2019-05-12 DIAGNOSIS — D2261 Melanocytic nevi of right upper limb, including shoulder: Secondary | ICD-10-CM | POA: Diagnosis not present

## 2019-05-12 DIAGNOSIS — L570 Actinic keratosis: Secondary | ICD-10-CM | POA: Diagnosis not present

## 2019-05-12 DIAGNOSIS — D225 Melanocytic nevi of trunk: Secondary | ICD-10-CM | POA: Diagnosis not present

## 2019-05-12 DIAGNOSIS — L821 Other seborrheic keratosis: Secondary | ICD-10-CM | POA: Diagnosis not present

## 2019-07-08 IMAGING — CT CT ABD-PELV W/ CM
2 of 5 series · 16 of 46 positions shown, 18 images · IV contrast (Omni 300)
Comparison: None.

CLINICAL DATA: 61-year-old female with abdominal pain since
[REDACTED], progressively worsening. No associated nausea, vomiting or
diarrhea.

EXAM:
CT ABDOMEN AND PELVIS WITH CONTRAST
TECHNIQUE: Multidetector CT imaging of the abdomen and pelvis was performed
using the standard protocol following bolus administration of
intravenous contrast.
CONTRAST:  100mL OMNIPAQUE IOHEXOL 300 MG/ML  SOLN

[Series 3: a/p w/ 5mm · axial · 0.94mm/px · z∈[+723,+1138]mm · 13 of 93 slices shown, 15 images]
[im 5/93  soft-tissue]
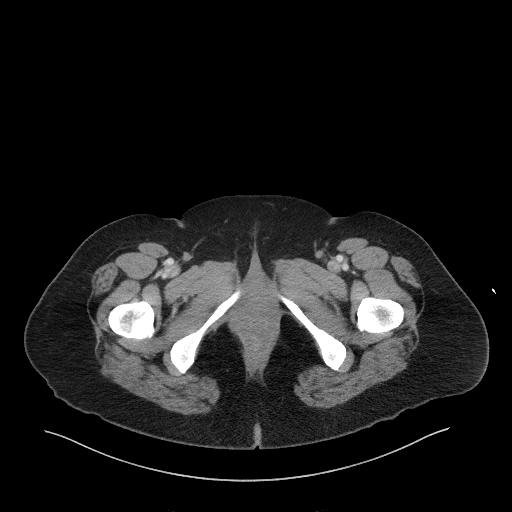
[im 5/93  bone]
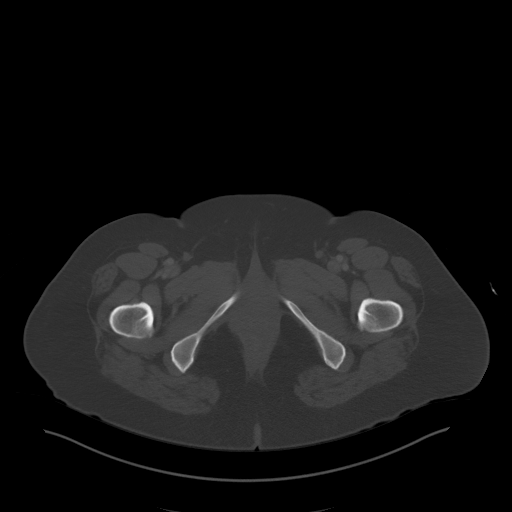
[im 15/93  soft-tissue]
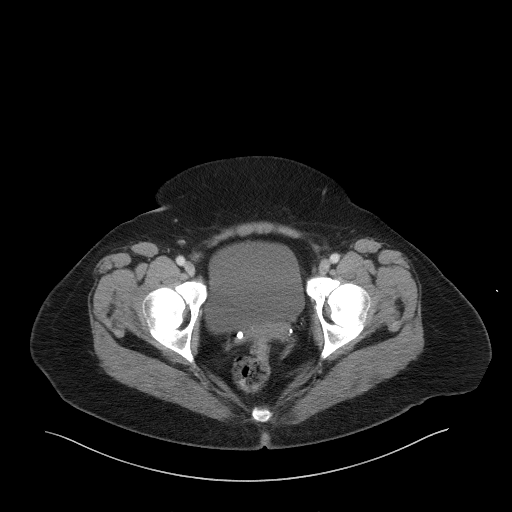
[im 20/93  soft-tissue]
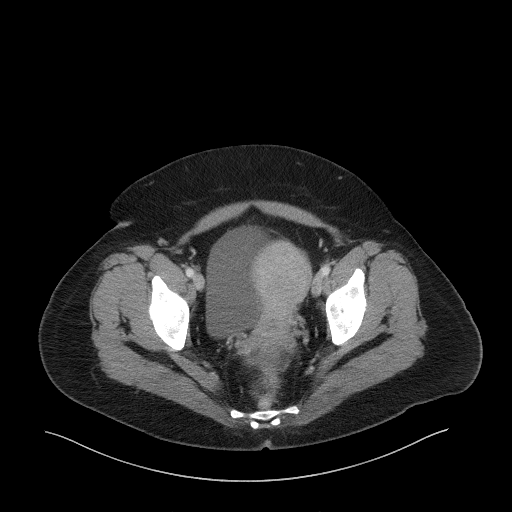
[im 25/93  soft-tissue]
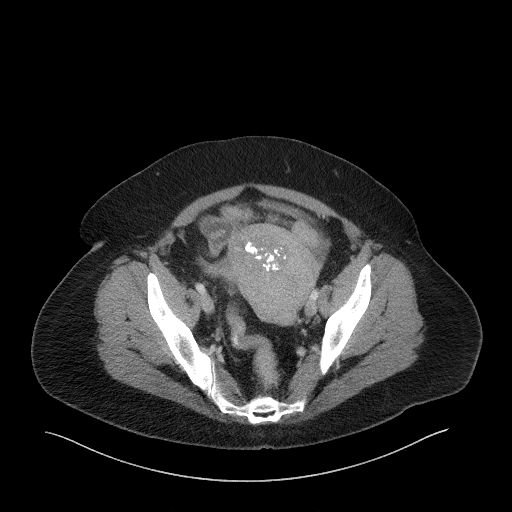
[im 34/93  soft-tissue]
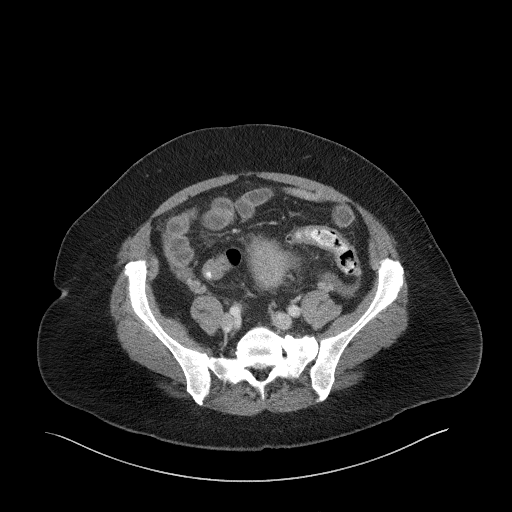
[im 39/93  soft-tissue]
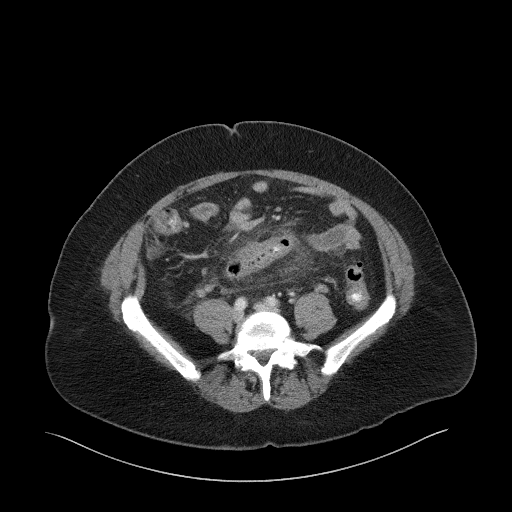
[im 49/93  soft-tissue]
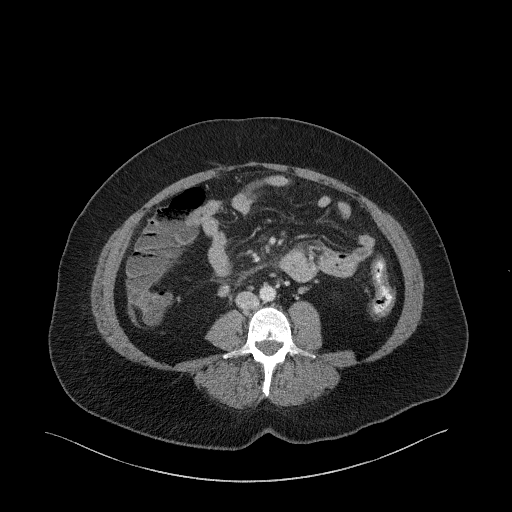
[im 54/93  soft-tissue]
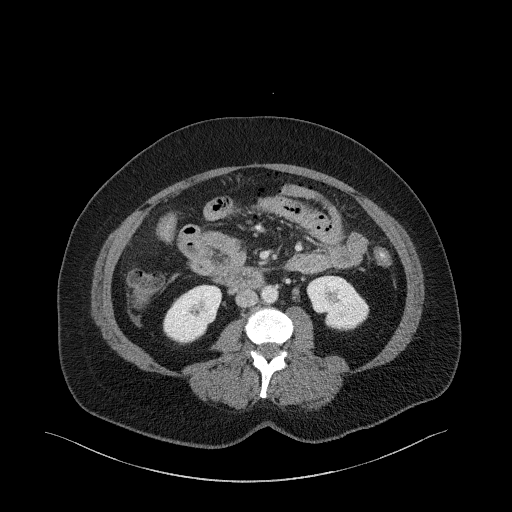
[im 59/93  soft-tissue]
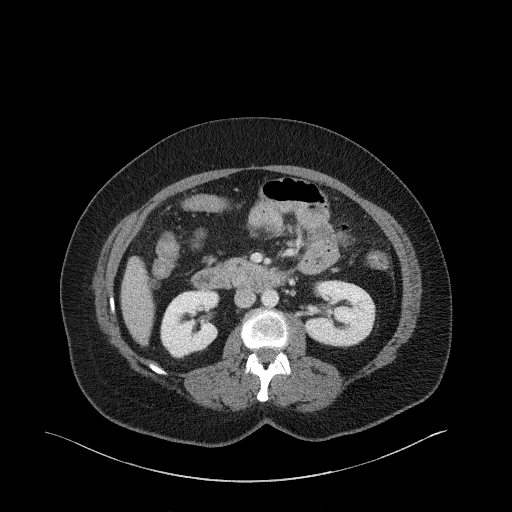
[im 59/93  bone]
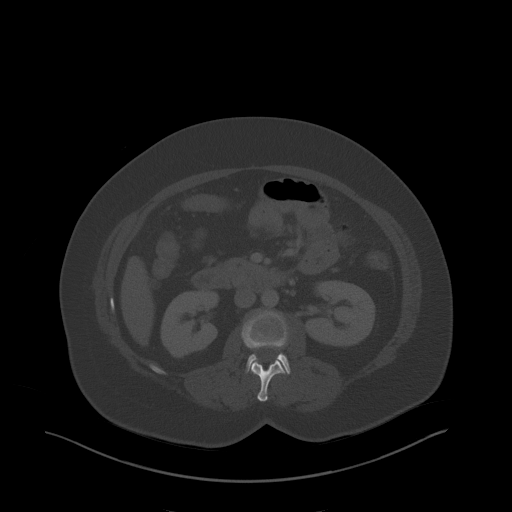
[im 68/93  soft-tissue]
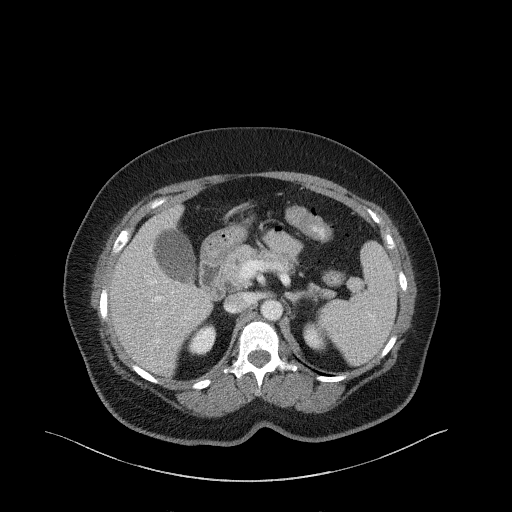
[im 73/93  soft-tissue]
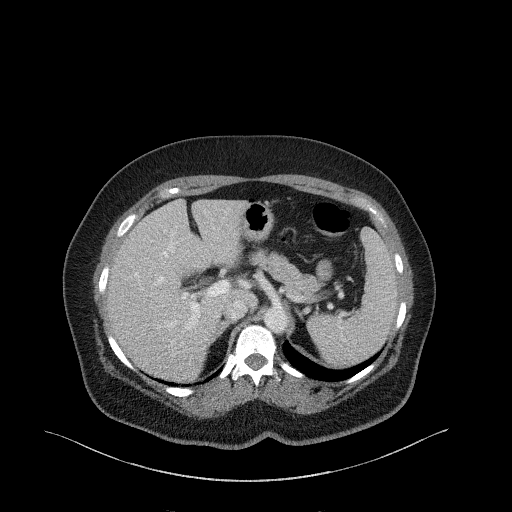
[im 78/93  soft-tissue]
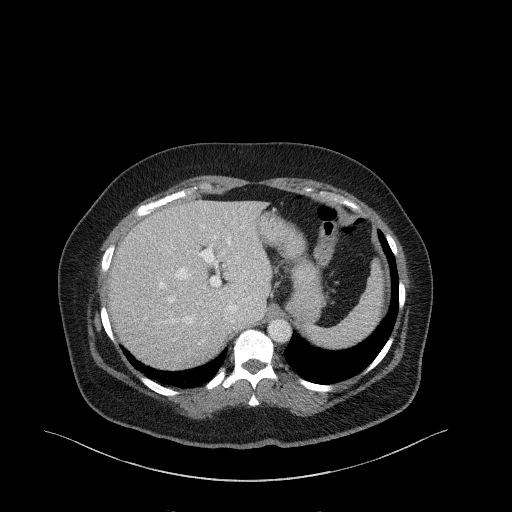
[im 88/93  soft-tissue]
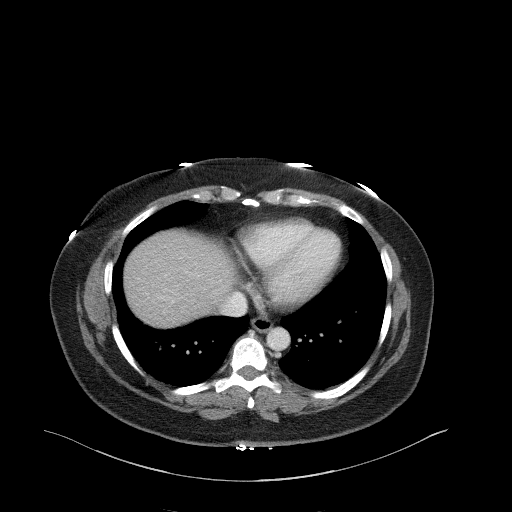

[Series 6: a/p w/ cor · coronal · 0.89mm/px · 3 of 191 slices shown]
[im 64/191  soft-tissue]
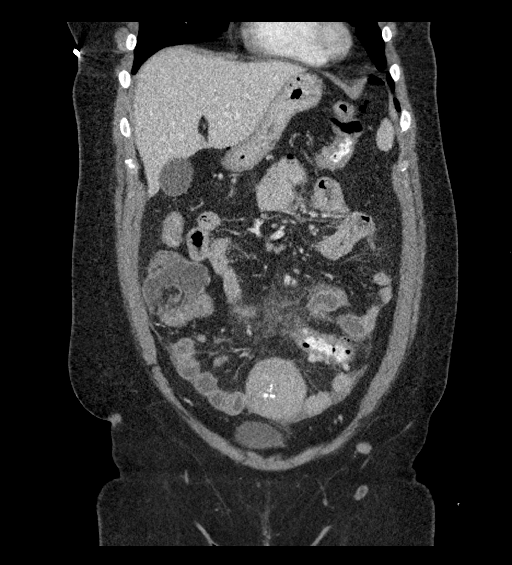
[im 85/191  soft-tissue]
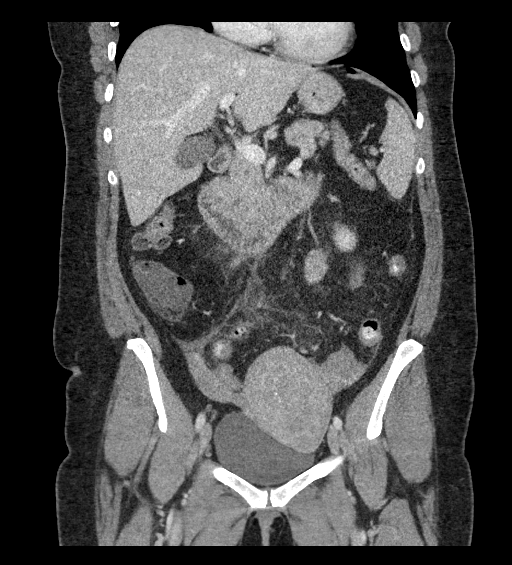
[im 106/191  soft-tissue]
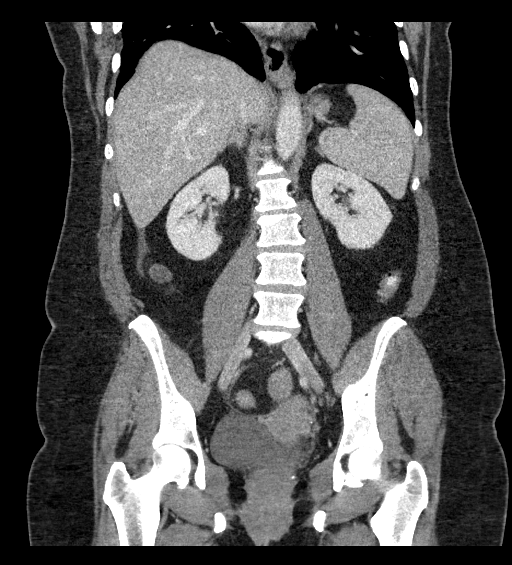

[16 of 46 positions shown; findings below may reference images not displayed]

FINDINGS: Lower chest: Unremarkable.

Hepatobiliary: No definite cystic or solid hepatic lesions. No intra
or extrahepatic biliary ductal dilatation. Gallbladder is normal in
appearance.

Pancreas: No pancreatic mass. No pancreatic ductal dilatation. No
pancreatic or peripancreatic fluid or inflammatory changes.

Spleen: Unremarkable.

Adrenals/Urinary Tract: Bilateral kidneys and bilateral adrenal
glands are normal in appearance. No hydroureteronephrosis. Urinary
bladder is normal in appearance.

Stomach/Bowel: Stomach is normal in appearance. No pathologic
dilatation of small bowel or colon. Several scattered colonic
diverticulae are noted. Importantly, in the region of the mid
sigmoid colon there are surrounding inflammatory changes in the
associated mesocolon, as well as extraluminal gas best appreciated
on axial image 51 of series 3, compatible with an acute
diverticulitis. This extraluminal gas collection also has some
associated fluid component measuring overall 3.2 x 1.4 x 2.4 cm
(axial image 51 of series 3 and coronal image 74 of series 6),
compatible with a small diverticular abscess. This has presumably
perforated given the small volume of pneumoperitoneum. Normal
appendix.

Vascular/Lymphatic: No significant atherosclerotic disease, aneurysm
or dissection noted in the abdominal or pelvic vasculature. No
lymphadenopathy noted in the abdomen or pelvis.

Reproductive: Multiple uterine lesions are noted demonstrating
heterogeneous attenuation, presumably multifocal fibroids. The
largest of these measures up to 5.5 cm in diameter in the fundus.
Ovaries are unremarkable in appearance.

Other: Trace volume of ascites. Small amount of pneumoperitoneum
scattered throughout the peritoneal cavity.

Musculoskeletal: There are no aggressive appearing lytic or blastic
lesions noted in the visualized portions of the skeleton.
IMPRESSION: 1. Findings are compatible with an acute diverticulitis in the mid
sigmoid colon with diverticular abscess and signs of frank
perforation. Emergent surgical consultation is strongly recommended.
2. Fibroid uterus, as above.
3. Additional incidental findings, as above.

Critical Value/emergent results were called by telephone at the time
of interpretation on 01/30/2018 at [DATE] to PA NAIF ZO,
who verbally acknowledged these results.

## 2019-12-16 LAB — HM MAMMOGRAPHY

## 2020-01-03 ENCOUNTER — Encounter: Payer: Self-pay | Admitting: Internal Medicine

## 2020-12-27 ENCOUNTER — Ambulatory Visit (INDEPENDENT_AMBULATORY_CARE_PROVIDER_SITE_OTHER): Payer: 59 | Admitting: Family Medicine

## 2020-12-27 ENCOUNTER — Other Ambulatory Visit (HOSPITAL_COMMUNITY)
Admission: RE | Admit: 2020-12-27 | Discharge: 2020-12-27 | Disposition: A | Discharge status: Home / Self Care | Payer: BLUE CROSS/BLUE SHIELD | Source: Ambulatory Visit | Attending: Family Medicine | Admitting: Family Medicine

## 2020-12-27 ENCOUNTER — Encounter: Payer: Self-pay | Admitting: Family Medicine

## 2020-12-27 ENCOUNTER — Other Ambulatory Visit: Payer: Self-pay

## 2020-12-27 VITALS — BP 118/72 | HR 80 | Ht 69.0 in | Wt 184.2 lb

## 2020-12-27 DIAGNOSIS — Z0000 Encounter for general adult medical examination without abnormal findings: Secondary | ICD-10-CM | POA: Diagnosis not present

## 2020-12-27 DIAGNOSIS — Z124 Encounter for screening for malignant neoplasm of cervix: Secondary | ICD-10-CM | POA: Insufficient documentation

## 2020-12-27 DIAGNOSIS — E2839 Other primary ovarian failure: Secondary | ICD-10-CM

## 2020-12-27 DIAGNOSIS — Z79899 Other long term (current) drug therapy: Secondary | ICD-10-CM

## 2020-12-27 DIAGNOSIS — Z136 Encounter for screening for cardiovascular disorders: Secondary | ICD-10-CM | POA: Diagnosis not present

## 2020-12-27 DIAGNOSIS — E7800 Pure hypercholesterolemia, unspecified: Secondary | ICD-10-CM | POA: Diagnosis not present

## 2020-12-27 DIAGNOSIS — Z1329 Encounter for screening for other suspected endocrine disorder: Secondary | ICD-10-CM | POA: Diagnosis not present

## 2020-12-27 DIAGNOSIS — Z1239 Encounter for other screening for malignant neoplasm of breast: Secondary | ICD-10-CM

## 2020-12-27 NOTE — Patient Instructions (Signed)
Preventive Care 63-63 Years Old, Female Preventive care refers to lifestyle choices and visits with your health care provider that can promote health and wellness. This includes:  A yearly physical exam. This is also called an annual wellness visit.  Regular dental and eye exams.  Immunizations.  Screening for certain conditions.  Healthy lifestyle choices, such as: ? Eating a healthy diet. ? Getting regular exercise. ? Not using drugs or products that contain nicotine and tobacco. ? Limiting alcohol use. What can I expect for my preventive care visit? Physical exam Your health care provider will check your:  Height and weight. These may be used to calculate your BMI (body mass index). BMI is a measurement that tells if you are at a healthy weight.  Heart rate and blood pressure.  Body temperature.  Skin for abnormal spots. Counseling Your health care provider may ask you questions about your:  Past medical problems.  Family's medical history.  Alcohol, tobacco, and drug use.  Emotional well-being.  Home life and relationship well-being.  Sexual activity.  Diet, exercise, and sleep habits.  Work and work Statistician.  Access to firearms.  Method of birth control.  Menstrual cycle.  Pregnancy history. What immunizations do I need? Vaccines are usually given at various ages, according to a schedule. Your health care provider will recommend vaccines for you based on your age, medical history, and lifestyle or other factors, such as travel or where you work.   What tests do I need? Blood tests  Lipid and cholesterol levels. These may be checked every 5 years, or more often if you are over 63 years old.  Hepatitis C test.  Hepatitis B test. Screening  Lung cancer screening. You may have this screening every year starting at age 63 if you have a 30-pack-year history of smoking and currently smoke or have quit within the past 15 years.  Colorectal cancer  screening. ? All adults should have this screening starting at age 52 and continuing until age 17. ? Your health care provider may recommend screening at age 49 if you are at increased risk. ? You will have tests every 1-10 years, depending on your results and the type of screening test.  Diabetes screening. ? This is done by checking your blood sugar (glucose) after you have not eaten for a while (fasting). ? You may have this done every 1-3 years.  Mammogram. ? This may be done every 1-2 years. ? Talk with your health care provider about when you should start having regular mammograms. This may depend on whether you have a family history of breast cancer.  BRCA-related cancer screening. This may be done if you have a family history of breast, ovarian, tubal, or peritoneal cancers.  Pelvic exam and Pap test. ? This may be done every 3 years starting at age 10. ? Starting at age 11, this may be done every 5 years if you have a Pap test in combination with an HPV test. Other tests  STD (sexually transmitted disease) testing, if you are at risk.  Bone density scan. This is done to screen for osteoporosis. You may have this scan if you are at high risk for osteoporosis. Talk with your health care provider about your test results, treatment options, and if necessary, the need for more tests. Follow these instructions at home: Eating and drinking  Eat a diet that includes fresh fruits and vegetables, whole grains, lean protein, and low-fat dairy products.  Take vitamin and mineral supplements  as recommended by your health care provider.  Do not drink alcohol if: ? Your health care provider tells you not to drink. ? You are pregnant, may be pregnant, or are planning to become pregnant.  If you drink alcohol: ? Limit how much you have to 0-1 drink a day. ? Be aware of how much alcohol is in your drink. In the [place] one drink equals one 12 oz bottle of beer (355 mL), one 5 oz glass of  wine (148 mL), or one 1 oz glass of hard liquor (44 mL).   Lifestyle  Take daily care of your teeth and gums. Brush your teeth every morning and night with fluoride toothpaste. Floss one time each day.  Stay active. Exercise for at least 30 minutes 5 or more days each week.  Do not use any products that contain nicotine or tobacco, such as cigarettes, e-cigarettes, and chewing tobacco. If you need help quitting, ask your health care provider.  Do not use drugs.  If you are sexually active, practice safe sex. Use a condom or other form of protection to prevent STIs (sexually transmitted infections).  If you do not wish to become pregnant, use a form of birth control. If you plan to become pregnant, see your health care provider for a prepregnancy visit.  If told by your health care provider, take low-dose aspirin daily starting at age 50.  Find healthy ways to cope with stress, such as: ? Meditation, yoga, or listening to music. ? Journaling. ? Talking to a trusted person. ? Spending time with friends and family. Safety  Always wear your seat belt while driving or riding in a vehicle.  Do not drive: ? If you have been drinking alcohol. Do not ride with someone who has been drinking. ? When you are tired or distracted. ? While texting.  Wear a helmet and other protective equipment during sports activities.  If you have firearms in your house, make sure you follow all gun safety procedures. What's next?  Visit your health care provider once a year for an annual wellness visit.  Ask your health care provider how often you should have your eyes and teeth checked.  Stay up to date on all vaccines. This information is not intended to replace advice given to you by your health care provider. Make sure you discuss any questions you have with your health care provider. Document Revised: 06/19/2020 Document Reviewed: 05/27/2018 Elsevier Patient Education  2021 Elsevier Inc.  

## 2020-12-27 NOTE — Progress Notes (Signed)
Subjective:    Patient ID: Heather Ochoa, female    DOB: 06/21/1956, 65 y.o.   MRN: [id number]  HPI Chief Complaint  Patient presents with  . nonfasting cpe    Nonfasting cpe, no concerns, pap was 2018- would like it done again today    She is here for a complete physical exam. Last CPE: 2019  Other providers: OB/GYN- [facility]  Dr. Carlye Grippe- Dentist.  [facility].  Chiropractor-Dr. Carma Leaven every 3 weeks.  GI- Dr. Ardis Hughs   Social history: Lives withhusband, no kids, worksin HR for [employer] Denies smoking or drug use.  Drinks a glass of wine 3-4 nights/wk Diet:fairly unhealthy Excerise:yoga, treadmill   Immunizations: UTD   Health maintenance:  Mammogram:  Colonoscopy: 07/2018 Last Gynecological Exam: Last Menstrual cycle: 03/2016  DEXA: never  Last Dental Exam: every 6 months Last Eye Exam: January 2022  Wears seatbelt always, uses sunscreen, smoke detectors in home and functioning, does not text while driving and feels safe in home environment.   Reviewed allergies, medications, past medical, surgical, family, and social history.   Review of Systems Review of Systems Constitutional: -fever, -chills, -sweats, -unexpected weight change,-fatigue ENT: -runny nose, -ear pain, -sore throat Cardiology:  -chest pain, -palpitations, -edema Respiratory: -cough, -shortness of breath, -wheezing Gastroenterology: -abdominal pain, -nausea, -vomiting, -diarrhea, -constipation  Hematology: -bleeding or bruising problems Musculoskeletal: -arthralgias, -myalgias, -joint swelling, -back pain Ophthalmology: -vision changes Urology: -dysuria, -difficulty urinating, -hematuria, -urinary frequency, -urgency Neurology: -headache, -weakness, -tingling, -numbness       Objective:   Physical Exam BP 118/72   Pulse 80   Ht 5\' 9"  (1.753 m)   Wt 184 lb 3.2 oz (83.6 kg)   LMP 04/02/2016   BMI 27.20 kg/m   General Appearance:     Alert, cooperative, no distress, appears stated age  Head:    Normocephalic, without obvious abnormality, atraumatic  Eyes:    PERRL, conjunctiva/corneas clear, EOM's intact  Ears:    Normal TM's and external ear canals  Nose:   Mask on   Throat:   Mask on   Neck:   Supple, no lymphadenopathy;  thyroid:  no   enlargement/tenderness/nodules; no JVD  Back:    Spine nontender, no curvature, ROM normal, no CVA     tenderness  Lungs:     Clear to auscultation bilaterally without wheezes, rales or     ronchi; respirations unlabored  Chest Wall:    No tenderness or deformity   Heart:    Regular rate and rhythm, S1 and S2 normal, no murmur, rub   or gallop  Breast Exam:    No tenderness, masses, or nipple discharge or inversion.      No axillary lymphadenopathy  Abdomen:     Soft, non-tender, nondistended, normoactive bowel sounds,    no masses, no hepatosplenomegaly  Genitalia:    Normal external genitalia without lesions.  BUS and vagina normal; cervix without lesions, or cervical motion tenderness. No abnormal vaginal discharge.  Uterus and adnexa not enlarged, nontender, no masses.  Pap performed. Chaperone present.      Extremities:   No clubbing, cyanosis or edema  Pulses:   2+ and symmetric all extremities  Skin:   Skin color, texture, turgor normal, no rashes or lesions  Lymph nodes:   Cervical, supraclavicular, and axillary nodes normal  Neurologic:   CNII-XII intact, normal strength, sensation and gait          Psych:   Normal mood,  affect, hygiene and grooming.        Assessment & Plan:  Routine general medical examination at a health care facility - Plan: CBC with Differential/Platelet, Comprehensive metabolic panel, TSH, T4, free, Lipid panel -Preventive health care reviewed.  Updated Pap smear.  She will call and schedule mammogram and DEXA.  Colonoscopy up-to-date.  Counseling on healthy lifestyle including diet and exercise.  Immunizations reviewed.  Discussed safety and health  promotion.  Elevated LDL cholesterol level - Plan: Lipid panel -Recommend low-fat diet and increasing physical activity.  Follow-up pending lipid panel results  Screening for heart disease - Plan: EKG 12-Lead -EKG shows NSR and no acute changes.  Read by myself and Dr. Redmond School.  Screening for thyroid disorder - Plan: TSH, T4, free  Screening for cervical cancer - Plan: Cytology - PAP([facility]) -As tolerated this well.  Chaperone present.  Encounter for breast cancer screening using non-mammogram modality -She will call and schedule her mammogram at [facility] per her preference  Medication management - Plan: VITAMIN D 25 Hydroxy (Vit-D Deficiency, Fractures) -She is currently taking vitamin D and would like to have this checked so that we can adjust her dose as appropriate  Estrogen deficiency - Plan: VITAMIN D 25 Hydroxy (Vit-D Deficiency, Fractures) -She has never had a bone density and she will schedule this at [facility] with her mammogram.

## 2020-12-28 ENCOUNTER — Other Ambulatory Visit: Payer: 59

## 2020-12-28 DIAGNOSIS — Z1329 Encounter for screening for other suspected endocrine disorder: Secondary | ICD-10-CM

## 2020-12-28 DIAGNOSIS — Z79899 Other long term (current) drug therapy: Secondary | ICD-10-CM

## 2020-12-28 DIAGNOSIS — E7800 Pure hypercholesterolemia, unspecified: Secondary | ICD-10-CM

## 2020-12-28 DIAGNOSIS — E2839 Other primary ovarian failure: Secondary | ICD-10-CM

## 2020-12-28 DIAGNOSIS — Z0000 Encounter for general adult medical examination without abnormal findings: Secondary | ICD-10-CM

## 2020-12-29 LAB — CBC WITH DIFFERENTIAL/PLATELET
Basophils Absolute: 0.1 10*3/uL (ref 0.0–0.2)
Basos: 1 %
EOS (ABSOLUTE): 0.1 10*3/uL (ref 0.0–0.4)
Eos: 1 %
Hematocrit: 45.4 % (ref 34.0–46.6)
Hemoglobin: 15.2 g/dL (ref 11.1–15.9)
Immature Grans (Abs): 0 10*3/uL (ref 0.0–0.1)
Immature Granulocytes: 0 %
Lymphocytes Absolute: 2.9 10*3/uL (ref 0.7–3.1)
Lymphs: 50 %
MCH: 29.6 pg (ref 26.6–33.0)
MCHC: 33.5 g/dL (ref 31.5–35.7)
MCV: 89 fL (ref 79–97)
Monocytes Absolute: 0.4 10*3/uL (ref 0.1–0.9)
Monocytes: 8 %
Neutrophils Absolute: 2.3 10*3/uL (ref 1.4–7.0)
Neutrophils: 40 %
Platelets: 273 10*3/uL (ref 150–450)
RBC: 5.13 x10E6/uL (ref 3.77–5.28)
RDW: 11.9 % (ref 11.7–15.4)
WBC: 5.8 10*3/uL (ref 3.4–10.8)

## 2020-12-29 LAB — COMPREHENSIVE METABOLIC PANEL
ALT: 22 IU/L (ref 0–32)
AST: 20 IU/L (ref 0–40)
Albumin/Globulin Ratio: 2.5 — ABNORMAL HIGH (ref 1.2–2.2)
Albumin: 4.5 g/dL (ref 3.8–4.8)
Alkaline Phosphatase: 71 IU/L (ref 44–121)
BUN/Creatinine Ratio: 16 (ref 12–28)
BUN: 12 mg/dL (ref 8–27)
Bilirubin Total: 0.5 mg/dL (ref 0.0–1.2)
CO2: 22 mmol/L (ref 20–29)
Calcium: 9.6 mg/dL (ref 8.7–10.3)
Chloride: 101 mmol/L (ref 96–106)
Creatinine, Ser: 0.77 mg/dL (ref 0.57–1.00)
Globulin, Total: 1.8 g/dL (ref 1.5–4.5)
Glucose: 93 mg/dL (ref 65–99)
Potassium: 4.1 mmol/L (ref 3.5–5.2)
Sodium: 138 mmol/L (ref 134–144)
Total Protein: 6.3 g/dL (ref 6.0–8.5)
eGFR: 87 mL/min/{1.73_m2} (ref >59)

## 2020-12-29 LAB — TSH: TSH: 2.01 u[IU]/mL (ref 0.450–4.500)

## 2020-12-29 LAB — LIPID PANEL
Chol/HDL Ratio: 3.5 ratio (ref 0.0–4.4)
Cholesterol, Total: 248 mg/dL — ABNORMAL HIGH (ref 100–199)
HDL: 70 mg/dL (ref >39)
LDL Chol Calc (NIH): 156 mg/dL — ABNORMAL HIGH (ref 0–99)
Triglycerides: 123 mg/dL (ref 0–149)
VLDL Cholesterol Cal: 22 mg/dL (ref 5–40)

## 2020-12-29 LAB — VITAMIN D 25 HYDROXY (VIT D DEFICIENCY, FRACTURES): Vit D, 25-Hydroxy: 34.6 ng/mL (ref 30.0–100.0)

## 2020-12-29 LAB — T4, FREE: Free T4: 1.74 ng/dL (ref 0.82–1.77)

## 2020-12-31 LAB — CYTOLOGY - PAP
Comment: NEGATIVE
Diagnosis: NEGATIVE
High risk HPV: NEGATIVE

## 2021-02-13 LAB — HM DEXA SCAN

## 2021-02-13 LAB — HM MAMMOGRAPHY

## 2021-02-19 ENCOUNTER — Encounter: Payer: Self-pay | Admitting: Internal Medicine

## 2021-02-20 ENCOUNTER — Encounter: Payer: Self-pay | Admitting: Internal Medicine

## 2021-02-20 ENCOUNTER — Telehealth: Payer: Self-pay | Admitting: Internal Medicine

## 2021-02-20 ENCOUNTER — Encounter: Payer: Self-pay | Admitting: Family Medicine

## 2021-02-20 NOTE — Telephone Encounter (Signed)
Pt is wanting to know what does osteopenia means and what can she do to help this. She does eat a lot of dairy for calcium but no calcium supplements. Please advise

## 2021-02-20 NOTE — Telephone Encounter (Signed)
Osteopenia means thinning bones but not yet osteoporosis.  It is recommended that she get at least 1200 mg of calcium per day and there are lots of ways to get calcium in her diet.  It is also recommended that she take a vitamin D supplement 1000 or 2000 IUs daily.  Weightbearing exercises also necessary to help prevent worsening bone loss.  We can do another bone density in 2 years

## 2021-02-20 NOTE — Telephone Encounter (Signed)
Pt was notified of results and asked that I send in results through [facility]

## 2021-02-21 ENCOUNTER — Encounter: Payer: Self-pay | Admitting: Family Medicine

## 2022-02-21 ENCOUNTER — Encounter: Payer: Self-pay | Admitting: Family Medicine

## 2022-06-16 ENCOUNTER — Encounter: Payer: Self-pay | Admitting: Family

## 2022-06-16 ENCOUNTER — Ambulatory Visit (INDEPENDENT_AMBULATORY_CARE_PROVIDER_SITE_OTHER): Payer: 59 | Admitting: Family

## 2022-06-16 VITALS — BP 132/74 | HR 66 | Temp 98.2°F | Ht 69.0 in | Wt 190.6 lb

## 2022-06-16 DIAGNOSIS — R140 Abdominal distension (gaseous): Secondary | ICD-10-CM

## 2022-06-16 DIAGNOSIS — R590 Localized enlarged lymph nodes: Secondary | ICD-10-CM

## 2022-06-16 DIAGNOSIS — R1032 Left lower quadrant pain: Secondary | ICD-10-CM

## 2022-06-16 DIAGNOSIS — K219 Gastro-esophageal reflux disease without esophagitis: Secondary | ICD-10-CM | POA: Insufficient documentation

## 2022-06-16 DIAGNOSIS — D509 Iron deficiency anemia, unspecified: Secondary | ICD-10-CM

## 2022-06-16 DIAGNOSIS — R933 Abnormal findings on diagnostic imaging of other parts of digestive tract: Secondary | ICD-10-CM | POA: Insufficient documentation

## 2022-06-16 DIAGNOSIS — K5730 Diverticulosis of large intestine without perforation or abscess without bleeding: Secondary | ICD-10-CM | POA: Insufficient documentation

## 2022-06-16 DIAGNOSIS — Z7689 Persons encountering health services in other specified circumstances: Secondary | ICD-10-CM

## 2022-06-16 DIAGNOSIS — E669 Obesity, unspecified: Secondary | ICD-10-CM | POA: Insufficient documentation

## 2022-06-16 DIAGNOSIS — E782 Mixed hyperlipidemia: Secondary | ICD-10-CM

## 2022-06-16 DIAGNOSIS — N926 Irregular menstruation, unspecified: Secondary | ICD-10-CM

## 2022-06-16 DIAGNOSIS — D259 Leiomyoma of uterus, unspecified: Secondary | ICD-10-CM | POA: Insufficient documentation

## 2022-06-16 NOTE — Progress Notes (Signed)
New Patient Office Visit  Subjective:  Patient ID: Heather Ochoa, female    DOB: [birth year]  Age: 66 y.o. MRN: [id number]  CC:  Chief Complaint  Patient presents with   Establish Care    No concerns     HPI Heather Ochoa presents for establishing care today.  TOC:  Pt is here to establish care. She reports she is working full time. Hx of bad episode of Diverticulitis causing perforation a few years ago, no episodes since. She has a uterine fibroid, not causing issues currently. She is postmenopausal. Her last labs were last year and she has mixed hyperlipidemia, with high numbers, but never put on a statin med. Reports husband had a rare lymphoma that grew a tumor around his colon and had colectomy w/ permanent ostomy, but doing well now. Hyperlipidemia: Patient is currently maintained on the following medication for hyperlipidemia: none, lifestyle modifications. Patient reports good compliance with low fat/low cholesterol diet.  Last lipid panel as follows: Lab Results  Component Value Date   CHOL 248 (H) 12/28/2020   HDL 70 12/28/2020   LDLCALC 156 (H) 12/28/2020   TRIG 123 12/28/2020   CHOLHDL 3.5 12/28/2020  The 10-year ASCVD risk score (Arnett DK, et al., 2019) is: 4.8%   Assessment & Plan:   Problem List Items Addressed This Visit       Other   Mixed hyperlipidemia - Primary    chronic - last lipids 12/2020 - 4.8% risk manages with diet & exercise denies hx of statin med advised pt to schedule a fasting lab or physical to recheck in near future advised on low saturated fat diet      Other Visit Diagnoses     Encounter to establish care with new doctor           Subjective:    Outpatient Medications Prior to Visit  Medication Sig Dispense Refill   Ascorbic Acid (VITAMIN C) 1000 MG tablet Take 1,000 mg by mouth daily.     Coenzyme Q10 (COQ10 PO) Take by mouth.     MAGNESIUM CARBONATE PO Take by mouth.     saccharomyces boulardii (FLORASTOR) 250 MG capsule  Take 1 capsule (250 mg total) by mouth 2 (two) times daily. You can find a probiotic over the counter.     No facility-administered medications prior to visit.   Past Medical History:  Diagnosis Date   Abdominal bloating 5/36/1443   Ankle clicking 1/54/0086   Bowel obstruction (HCC)    Diverticulitis    GERD (gastroesophageal reflux disease)    Dr. Collene Mares is GI   Hypercholesterolemia    Imaging of gastrointestinal tract abnormal 06/16/2022   Irregular periods 06/16/2022   Left lower quadrant pain 06/16/2022   Obesity 06/16/2022   Submandibular lymphadenopathy 7/61/9509   Umbilical hernia    Uterine leiomyoma    Past Surgical History:  Procedure Laterality Date   TONSILLECTOMY     UMBILICAL HERNIA REPAIR      Objective:   Today's Vitals: BP 132/74 (BP Location: Left Arm, Patient Position: Sitting, Cuff Size: Large)   Pulse 66   Temp 98.2 F (36.8 C) (Temporal)   Ht '5\' 9"'$  (1.753 m)   Wt 190 lb 9.6 oz (86.5 kg)   LMP 04/02/2016   SpO2 98%   BMI 28.15 kg/m   Physical Exam Vitals and nursing note reviewed.  Constitutional:      Appearance: Normal appearance.  Cardiovascular:     Rate and Rhythm: Normal rate and  regular rhythm.  Pulmonary:     Effort: Pulmonary effort is normal.     Breath sounds: Normal breath sounds.  Musculoskeletal:        General: Normal range of motion.  Skin:    General: Skin is warm and dry.  Neurological:     Mental Status: She is alert.  Psychiatric:        Mood and Affect: Mood normal.        Behavior: Behavior normal.     Jeanie Sewer, NP

## 2022-06-16 NOTE — Patient Instructions (Signed)
Welcome to [facility], It was a pleasure meeting you today!   Please schedule a physical with fasting labs at your convenience!    PLEASE NOTE: If you had any LAB tests please let us know if you have not heard back within a few days. You may see your results on MyChart before we have a chance to review them but we will give you a call once they are reviewed by [place]. If we ordered any REFERRALS today, please let us know if you have not heard from their office within the next week.  Let us know through MyChart if you are needing REFILLS, or have your pharmacy send [place] the request. You can also use MyChart to communicate with me or any office staff.   Please try these tips to maintain a healthy lifestyle:  Eat most of your calories during the day when you are active. Eliminate processed foods including packaged sweets (pies, cakes, cookies), reduce intake of potatoes, white bread, white pasta, and white rice. Look for whole grain options, oat flour or almond flour.  Each meal should contain half fruits/vegetables, one quarter protein, and one quarter carbs (no bigger than a computer mouse).  Cut down on sweet beverages. This includes juice, soda, and sweet tea. Also watch fruit intake, though this is a healthier sweet option, it still contains natural sugar! Limit to 3 servings daily.  Drink at least 1 8oz. glass of water with each meal and aim for at least 8 glasses per day  Exercise at least 150 minutes every week.

## 2022-06-16 NOTE — Assessment & Plan Note (Addendum)
   chronic - last lipids 12/2020 - 4.8% risk  manages with diet & exercise  denies hx of statin med  advised pt to schedule a fasting lab or physical to recheck in near future  advised on low saturated fat diet

## 2022-06-17 ENCOUNTER — Ambulatory Visit: Payer: 59 | Admitting: Family

## 2023-02-25 LAB — HM MAMMOGRAPHY

## 2023-03-11 ENCOUNTER — Ambulatory Visit (INDEPENDENT_AMBULATORY_CARE_PROVIDER_SITE_OTHER): Payer: 59 | Admitting: Family

## 2023-03-11 ENCOUNTER — Encounter: Payer: Self-pay | Admitting: Family

## 2023-03-11 VITALS — BP 124/68 | HR 64 | Temp 97.7°F | Ht 69.0 in | Wt 192.4 lb

## 2023-03-11 DIAGNOSIS — Z0000 Encounter for general adult medical examination without abnormal findings: Secondary | ICD-10-CM | POA: Diagnosis not present

## 2023-03-11 LAB — LIPID PANEL
Cholesterol: 241 mg/dL — ABNORMAL HIGH (ref 0–200)
HDL: 65.4 mg/dL (ref >39.00)
LDL Cholesterol: 139 mg/dL — ABNORMAL HIGH (ref 0–99)
NonHDL: 175.3
Total CHOL/HDL Ratio: 4
Triglycerides: 182 mg/dL — ABNORMAL HIGH (ref 0.0–149.0)
VLDL: 36.4 mg/dL (ref 0.0–40.0)

## 2023-03-11 LAB — CBC WITH DIFFERENTIAL/PLATELET
Basophils Absolute: 0 10*3/uL (ref 0.0–0.1)
Basophils Relative: 0.5 % (ref 0.0–3.0)
Eosinophils Absolute: 0.1 10*3/uL (ref 0.0–0.7)
Eosinophils Relative: 1.3 % (ref 0.0–5.0)
HCT: 43.4 % (ref 36.0–46.0)
Hemoglobin: 14.4 g/dL (ref 12.0–15.0)
Lymphocytes Relative: 51.3 % — ABNORMAL HIGH (ref 12.0–46.0)
Lymphs Abs: 2.8 10*3/uL (ref 0.7–4.0)
MCHC: 33.1 g/dL (ref 30.0–36.0)
MCV: 89.9 fl (ref 78.0–100.0)
Monocytes Absolute: 0.3 10*3/uL (ref 0.1–1.0)
Monocytes Relative: 5.6 % (ref 3.0–12.0)
Neutro Abs: 2.3 10*3/uL (ref 1.4–7.7)
Neutrophils Relative %: 41.3 % — ABNORMAL LOW (ref 43.0–77.0)
Platelets: 292 10*3/uL (ref 150.0–400.0)
RBC: 4.83 Mil/uL (ref 3.87–5.11)
RDW: 13.4 % (ref 11.5–15.5)
WBC: 5.5 10*3/uL (ref 4.0–10.5)

## 2023-03-11 LAB — COMPREHENSIVE METABOLIC PANEL
ALT: 24 U/L (ref 0–35)
AST: 21 U/L (ref 0–37)
Albumin: 4.3 g/dL (ref 3.5–5.2)
Alkaline Phosphatase: 69 U/L (ref 39–117)
BUN: 12 mg/dL (ref 6–23)
CO2: 23 mEq/L (ref 19–32)
Calcium: 9.6 mg/dL (ref 8.4–10.5)
Chloride: 105 mEq/L (ref 96–112)
Creatinine, Ser: 0.73 mg/dL (ref 0.40–1.20)
GFR: 86.76 mL/min (ref >60.00)
Glucose, Bld: 91 mg/dL (ref 70–99)
Potassium: 4.2 mEq/L (ref 3.5–5.1)
Sodium: 138 mEq/L (ref 135–145)
Total Bilirubin: 0.7 mg/dL (ref 0.2–1.2)
Total Protein: 6.9 g/dL (ref 6.0–8.3)

## 2023-03-11 LAB — TSH: TSH: 2.22 u[IU]/mL (ref 0.35–5.50)

## 2023-03-11 NOTE — Patient Instructions (Signed)
It was very nice to see you today!   I will review your lab results via MyChart in a few days.   Have a great summer!    PLEASE NOTE:  If you had any lab tests please let us know if you have not heard back within a few days. You may see your results on MyChart before we have a chance to review them but we will give you a call once they are reviewed by us. If we ordered any referrals today, please let us know if you have not heard from their office within the next week.    

## 2023-03-11 NOTE — Progress Notes (Signed)
Phone [phone]  Subjective:   Patient is a 67 y.o. female presenting for annual physical.    Chief Complaint  Patient presents with   Annual Exam    See problem oriented charting- ROS- full  review of systems was completed and negative.   The following were reviewed and entered/updated in epic: Past Medical History:  Diagnosis Date   Abdominal bloating 06/16/2022   Ankle clicking 05/14/2017   Bowel obstruction (HCC)    Diverticulitis    GERD (gastroesophageal reflux disease)    Dr. 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   Gastroesophageal reflux disease 06/16/2022   Mixed hyperlipidemia 06/16/2022   Uterine leiomyoma 06/16/2022   Diverticulitis of intestine with perforation and abscess 01/30/2018   Past Surgical History:  Procedure Laterality Date   TONSILLECTOMY     UMBILICAL HERNIA REPAIR      Family History  Problem Relation Age of Onset   Congestive Heart Failure Mother    Dementia Mother    Pneumonia Father    Stroke Brother    Breast cancer Paternal Grandmother    Colon cancer Neg Hx    Esophageal cancer Neg Hx    Stomach cancer Neg Hx    Rectal cancer Neg Hx    Medications- reviewed and updated Current Outpatient Medications  Medication Sig Dispense Refill   Ascorbic Acid (VITAMIN C) 1000 MG tablet Take 1,000 mg by mouth daily.     b complex vitamins capsule Take 1 capsule by mouth daily.     latanoprost (XALATAN) 0.005 % ophthalmic solution 1 drop at bedtime.     MAGNESIUM CARBONATE PO Take by mouth.     saccharomyces boulardii (FLORASTOR) 250 MG capsule Take 1 capsule (250 mg total) by mouth  2 (two) times daily. You can find a probiotic over the counter.     No current facility-administered medications for this visit.   Allergies-reviewed and updated Allergies  Allergen Reactions   Penicillins Other (See Comments)    Has patient had a PCN reaction causing immediate rash, facial/tongue/throat swelling, SOB or lightheadedness with hypotension: Unknown Has patient had a PCN reaction causing severe rash involving mucus membranes or skin necrosis: Unknown Has patient had a PCN reaction that required hospitalization: Unknown Has patient had a PCN reaction occurring within the last 10 years: No If all of the above answers are "NO", then may proceed with Cephalosporin use.   Childhood reaction     Social History   Social History Narrative   Not on file    Objective:  BP 124/68   Pulse 64   Temp 97.7 F (36.5 C) (Temporal)   Ht 5\' 9"  (1.753 m)   Wt 192 lb 6.4 oz (87.3 kg)   LMP 04/02/2016   SpO2 98%   BMI 28.41 kg/m  Physical Exam Vitals and nursing note reviewed.  Constitutional:      Appearance: Normal appearance.  HENT:     Head: Normocephalic.     Right Ear: Tympanic membrane normal.     Left Ear: Tympanic membrane normal.     Nose: Nose normal.     Mouth/Throat:  Mouth: Mucous membranes are moist.  Eyes:     Pupils: Pupils are equal, round, and reactive to light.  Cardiovascular:     Rate and Rhythm: Normal rate and regular rhythm.  Pulmonary:     Effort: Pulmonary effort is normal.     Breath sounds: Normal breath sounds.  Musculoskeletal:        General: Normal range of motion.     Cervical back: Normal range of motion.  Lymphadenopathy:     Cervical: No cervical adenopathy.  Skin:    General: Skin is warm and dry.  Neurological:     Mental Status: She is alert.  Psychiatric:        Mood and Affect: Mood normal.        Behavior: Behavior normal.     Assessment and Plan   Health Maintenance counseling: 1. Anticipatory guidance:  Patient counseled regarding regular dental exams q6 months, eye exams,  avoiding smoking and second hand smoke, limiting alcohol to 1 beverage per day, no illicit drugs.   2. Risk factor reduction:  Advised patient of need for regular exercise and diet rich with fruits and vegetables to reduce risk of heart attack and stroke. Exercise- walking 3 miles daily.  Wt Readings from Last 3 Encounters:  03/11/23 192 lb 6.4 oz (87.3 kg)  06/16/22 190 lb 9.6 oz (86.5 kg)  12/27/20 184 lb 3.2 oz (83.6 kg)   3. Immunizations/screenings/ancillary studies Immunization History  Administered Date(s) Administered   Influenza Inj Mdck Quad Pf 08/04/2018   PFIZER(Purple Top)SARS-COV-2 Vaccination 12/25/2019, 01/15/2020, 08/20/2020   Pfizer Covid-19 Vaccine Bivalent Booster 71yrs & up 03/07/2021, 08/05/2021   Td 09/29/1998   Tdap 04/15/2016   Zoster Recombinat (Shingrix) 05/29/2017, 09/15/2017   There are no preventive care reminders to display for this patient.  4. Cervical cancer screening- due next year 5. Breast cancer screening-  mammogram due 2026 6. Colon cancer screening - due this year 7. Skin cancer screening- advised regular sunscreen use. Denies worrisome, changing, or new skin lesions. pt has DERM and sees annually 8. Birth control/STD check- N/A 9. Osteoporosis screening- utd 10. Alcohol screening: glass of wine qod 11. Smoking associated screening (lung cancer screening, AAA screen 65-75, UA)- non- smoker  Annual physical exam -     Comprehensive metabolic panel -     CBC with Differential/Platelet -     Lipid panel -     TSH   Recommended follow up:  No follow-ups on file. No future appointments.   Lab/Order associations: fasting - just coffee w/cream   Dulce Sellar, NP

## 2024-03-04 LAB — HM MAMMOGRAPHY
